# Patient Record
Sex: Male | Born: 2005 | Race: Black or African American | Hispanic: No | Marital: Single | Smoking: Current some day smoker
Health system: Southern US, Community
[De-identification: ages and names within clinical notes are randomized; demographics above are authoritative.]

## PROBLEM LIST (undated history)

## (undated) DIAGNOSIS — D75A Glucose-6-phosphate dehydrogenase (G6PD) deficiency without anemia: Secondary | ICD-10-CM

## (undated) DIAGNOSIS — J45909 Unspecified asthma, uncomplicated: Secondary | ICD-10-CM

## (undated) HISTORY — PX: MYRINGOTOMY WITH TUBE PLACEMENT: SHX5663

---
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
## 2024-07-20 ENCOUNTER — Other Ambulatory Visit: Payer: Self-pay

## 2024-07-20 ENCOUNTER — Ambulatory Visit (HOSPITAL_COMMUNITY)
Admission: EM | Admit: 2024-07-20 | Discharge: 2024-07-21 | Disposition: A | Discharge status: Other Acute Inpatient Hospital | Attending: Psychiatry | Admitting: Psychiatry

## 2024-07-20 DIAGNOSIS — F1290 Cannabis use, unspecified, uncomplicated: Secondary | ICD-10-CM | POA: Diagnosis not present

## 2024-07-20 DIAGNOSIS — F19959 Other psychoactive substance use, unspecified with psychoactive substance-induced psychotic disorder, unspecified: Secondary | ICD-10-CM | POA: Insufficient documentation

## 2024-07-20 DIAGNOSIS — F411 Generalized anxiety disorder: Secondary | ICD-10-CM | POA: Diagnosis not present

## 2024-07-20 LAB — POCT URINE DRUG SCREEN - MANUAL ENTRY (I-SCREEN)
POC Amphetamine UR: NOT DETECTED
POC Buprenorphine (BUP): NOT DETECTED
POC Cocaine UR: NOT DETECTED
POC Marijuana UR: POSITIVE — AB
POC Methadone UR: NOT DETECTED
POC Methamphetamine UR: NOT DETECTED
POC Morphine: NOT DETECTED
POC Oxazepam (BZO): NOT DETECTED
POC Oxycodone UR: NOT DETECTED
POC Secobarbital (BAR): NOT DETECTED

## 2024-07-20 LAB — CBC WITH DIFFERENTIAL/PLATELET
Abs Immature Granulocytes: 0.03 K/uL (ref 0.00–0.07)
Basophils Absolute: 0.1 K/uL (ref 0.0–0.1)
Basophils Relative: 1 %
Eosinophils Absolute: 0.1 K/uL (ref 0.0–0.5)
Eosinophils Relative: 1 %
HCT: 46 % (ref 39.0–52.0)
Hemoglobin: 15.9 g/dL (ref 13.0–17.0)
Immature Granulocytes: 0 %
Lymphocytes Relative: 17 %
Lymphs Abs: 1.5 K/uL (ref 0.7–4.0)
MCH: 30.8 pg (ref 26.0–34.0)
MCHC: 34.6 g/dL (ref 30.0–36.0)
MCV: 89.1 fL (ref 80.0–100.0)
Monocytes Absolute: 0.8 K/uL (ref 0.1–1.0)
Monocytes Relative: 9 %
Neutro Abs: 6.4 K/uL (ref 1.7–7.7)
Neutrophils Relative %: 72 %
Platelets: 492 K/uL — ABNORMAL HIGH (ref 150–400)
RBC: 5.16 MIL/uL (ref 4.22–5.81)
RDW: 11.6 % (ref 11.5–15.5)
WBC: 8.8 K/uL (ref 4.0–10.5)
nRBC: 0 % (ref 0.0–0.2)

## 2024-07-20 LAB — COMPREHENSIVE METABOLIC PANEL WITH GFR
ALT: 18 U/L (ref 0–44)
AST: 29 U/L (ref 15–41)
Albumin: 5 g/dL (ref 3.5–5.0)
Alkaline Phosphatase: 66 U/L (ref 38–126)
Anion gap: 13 (ref 5–15)
BUN: 8 mg/dL (ref 6–20)
CO2: 23 mmol/L (ref 22–32)
Calcium: 9.9 mg/dL (ref 8.9–10.3)
Chloride: 98 mmol/L (ref 98–111)
Creatinine, Ser: 0.89 mg/dL (ref 0.61–1.24)
GFR, Estimated: >60 mL/min (ref >60)
Glucose, Bld: 97 mg/dL (ref 70–99)
Potassium: 2.9 mmol/L — ABNORMAL LOW (ref 3.5–5.1)
Sodium: 134 mmol/L — ABNORMAL LOW (ref 135–145)
Total Bilirubin: 2.3 mg/dL — ABNORMAL HIGH (ref 0.0–1.2)
Total Protein: 7.9 g/dL (ref 6.5–8.1)

## 2024-07-20 LAB — ETHANOL: Alcohol, Ethyl (B): <15 mg/dL (ref <15)

## 2024-07-20 LAB — TSH: TSH: 0.63 u[IU]/mL (ref 0.350–4.500)

## 2024-07-20 LAB — POTASSIUM: Potassium: 2.9 mmol/L — ABNORMAL LOW (ref 3.5–5.1)

## 2024-07-20 MED ORDER — LORAZEPAM 2 MG/ML IJ SOLN: 2.0000 mg | Freq: Three times a day (TID) | INTRAMUSCULAR | Status: DC | PRN

## 2024-07-20 MED ORDER — HALOPERIDOL LACTATE 5 MG/ML IJ SOLN: 5.0000 mg | Freq: Three times a day (TID) | INTRAMUSCULAR | Status: DC | PRN

## 2024-07-20 MED ORDER — DIPHENHYDRAMINE HCL 50 MG PO CAPS
50.0000 mg | ORAL_CAPSULE | Freq: Three times a day (TID) | ORAL | Status: DC | PRN
  Administered 2024-07-20: 50 mg via ORAL
  Filled 2024-07-20: qty 1

## 2024-07-20 MED ORDER — HYDROXYZINE HCL 25 MG PO TABS
25.0000 mg | ORAL_TABLET | Freq: Three times a day (TID) | ORAL | Status: DC | PRN
  Administered 2024-07-20: 25 mg via ORAL
  Filled 2024-07-20: qty 1

## 2024-07-20 MED ORDER — POTASSIUM CHLORIDE CRYS ER 20 MEQ PO TBCR
40.0000 meq | EXTENDED_RELEASE_TABLET | Freq: Once | ORAL | Status: AC
  Administered 2024-07-20: 40 meq via ORAL
  Filled 2024-07-20: qty 2

## 2024-07-20 MED ORDER — ALUM & MAG HYDROXIDE-SIMETH 200-200-20 MG/5ML PO SUSP: 30.0000 mL | ORAL | Status: DC | PRN

## 2024-07-20 MED ORDER — ACETAMINOPHEN 325 MG PO TABS: 650.0000 mg | ORAL_TABLET | Freq: Four times a day (QID) | ORAL | Status: DC | PRN

## 2024-07-20 MED ORDER — HALOPERIDOL LACTATE 5 MG/ML IJ SOLN: 10.0000 mg | Freq: Three times a day (TID) | INTRAMUSCULAR | Status: DC | PRN

## 2024-07-20 MED ORDER — HALOPERIDOL 5 MG PO TABS
5.0000 mg | ORAL_TABLET | Freq: Three times a day (TID) | ORAL | Status: DC | PRN
  Administered 2024-07-20: 5 mg via ORAL
  Filled 2024-07-20: qty 1

## 2024-07-20 MED ORDER — DIPHENHYDRAMINE HCL 50 MG/ML IJ SOLN: 50.0000 mg | Freq: Three times a day (TID) | INTRAMUSCULAR | Status: DC | PRN

## 2024-07-20 MED ORDER — MAGNESIUM HYDROXIDE 400 MG/5ML PO SUSP: 30.0000 mL | Freq: Every day | ORAL | Status: DC | PRN

## 2024-07-20 MED ORDER — TRAZODONE HCL 50 MG PO TABS
50.0000 mg | ORAL_TABLET | Freq: Every evening | ORAL | Status: DC | PRN
  Administered 2024-07-20: 50 mg via ORAL
  Filled 2024-07-20: qty 1

## 2024-07-20 MED ORDER — OLANZAPINE 5 MG PO TABS
5.0000 mg | ORAL_TABLET | Freq: Once | ORAL | Status: AC
  Administered 2024-07-20: 5 mg via ORAL
  Filled 2024-07-20: qty 1

## 2024-07-20 NOTE — ED Notes (Signed)
 Pt presented to [facility] voluntarily and admitted to continuous assessment unit w/ c/o paranoia and psychosis. Hx includes: no pertinent psych hx. Pt appears very anxious upon assessment, fidgety, apologizing a lot and reports he thinks that he thought he was Dexter (from the TV show). Denies SI/HI/AVH.   Skin assessment: unremarkable. Oriented to unit. Denies need of anything at this time. Pt in NAD at this time. Encouragement and support given. PRN atarax  given for anxiety. Will continue to monitor.
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
## 2024-07-20 NOTE — Progress Notes (Signed)
 [facility] LCSW Progress Note   07/20/2024    5:56 PM  Joe Johnston   [id number]   Type of Contact and Topic:  Psychiatric Bed Placement   Pt accepted to [facility] 505-1    Patient meets inpatient criteria per Joe Johnston, PMHNP   The attending provider will be Dr. Raliegh  Call report to [phone]    [name], RN @ [facility] notified.     Pt scheduled  to arrive at [facility] TODAY.    [name], MSW, LCSW-A  5:58 PM 07/20/2024

## 2024-07-20 NOTE — ED Notes (Signed)
 Pt reports feeling better after the hydroxyzine  and appears a lot less anxious. Pt still delusional. Pt asked, I was 18 the whole time?

## 2024-07-20 NOTE — ED Notes (Signed)
 Pt upset and hyperventilating and keeps saying, I think I really did something; I think I really thought I was Dexter. Pt coached through deep breathing and reassurance given as well as prn agitation meds. Provider notified of pt distress and at bedside speaking w/ pt.

## 2024-07-20 NOTE — ED Notes (Signed)
 Pt is lying in recliner, quiet, calm, reading a book. Pt denies having any complaints at this time. He wants to know when he will be transferred to another facility. Staff assured him that he will be updated on any new developments. Staff will continue to monitor pt for safety.

## 2024-07-20 NOTE — Group Note (Deleted)
 Group Topic: Recovery Basics  Group Date: 07/20/2024 Start Time: 0800 End Time: 0900 Facilitators: Rolinda Monta ORN, NT  Department: [facility]  Number of Participants: 6  Group Focus: chemical dependency education, chemical dependency issues, co-dependency, and community group Treatment Modality:  Cognitive Behavioral Therapy Interventions utilized were patient education, story telling, and support Purpose: enhance coping skills, express feelings, and increase insight   Name: Joe Johnston Date of Birth: 09-16-2006  MR: [id number]    Level of Participation: {THERAPIES; PSYCH GROUP PARTICIPATION OZCZO:76008} Quality of Participation: {THERAPIES; PSYCH QUALITY OF PARTICIPATION:23992} Interactions with others: {THERAPIES; PSYCH INTERACTIONS:23993} Mood/Affect: {THERAPIES; PSYCH MOOD/AFFECT:23994} Triggers (if applicable): *** Cognition: {THERAPIES; PSYCH COGNITION:23995} Progress: {THERAPIES; PSYCH PROGRESS:23997} Response: *** Plan: {THERAPIES; PSYCH EOJW:76003}  Patients Problems:  There are no active problems to display for this patient.
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
## 2024-07-20 NOTE — ED Notes (Signed)
 Patient eating dinner.  Calmer no further panic attacks.

## 2024-07-20 NOTE — ED Provider Notes (Cosign Needed Addendum)
 Patient continues to present with acute psychosis with paranoia, disorganized thinking, and emotional dysregulation. Differential includes first-episode psychosis, THC-induced psychosis, or an underlying schizophrenia-spectrum disorder. No immediate danger to self or others observed. Patient is willing to engage in inpatient psychiatric treatment voluntarily. Patient continues to deny suicidal ideation (SI), homicidal ideation (HI), or visual hallucinations (AVH) when directly asked, although behavior suggested internal stimuli. Provided redirection and reality testing (e.g., reassurance that his girlfriend and sister are safe, and he is safe)  Patient received Haldol  5 mg IM and Benadryl  50 mg IM for agitation and disorganization @ 1600.   Zyprexa  5 mg PO ordered to be administered in 3 hours, once initial sedation from Haldol /Benadryl  has stabilized. Patient is monitored closely for side effects or sedation.  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 This deficiency can lead to hemolytic anemia.

## 2024-07-21 ENCOUNTER — Inpatient Hospital Stay (HOSPITAL_COMMUNITY)
Admission: AD | Admit: 2024-07-21 | Discharge: 2024-07-23 | DRG: 897 | Disposition: A | Discharge status: Home / Self Care | Source: Intra-hospital | Attending: Psychiatry | Admitting: Psychiatry

## 2024-07-21 ENCOUNTER — Encounter (HOSPITAL_COMMUNITY): Payer: Self-pay | Admitting: Psychiatry

## 2024-07-21 DIAGNOSIS — Z818 Family history of other mental and behavioral disorders: Secondary | ICD-10-CM | POA: Diagnosis not present

## 2024-07-21 DIAGNOSIS — F1290 Cannabis use, unspecified, uncomplicated: Secondary | ICD-10-CM | POA: Diagnosis present

## 2024-07-21 DIAGNOSIS — Z91018 Allergy to other foods: Secondary | ICD-10-CM | POA: Diagnosis not present

## 2024-07-21 DIAGNOSIS — F17210 Nicotine dependence, cigarettes, uncomplicated: Secondary | ICD-10-CM | POA: Diagnosis present

## 2024-07-21 DIAGNOSIS — F419 Anxiety disorder, unspecified: Secondary | ICD-10-CM | POA: Diagnosis present

## 2024-07-21 DIAGNOSIS — J45909 Unspecified asthma, uncomplicated: Secondary | ICD-10-CM | POA: Diagnosis present

## 2024-07-21 DIAGNOSIS — D75A Glucose-6-phosphate dehydrogenase (G6PD) deficiency without anemia: Secondary | ICD-10-CM | POA: Diagnosis present

## 2024-07-21 DIAGNOSIS — Z79899 Other long term (current) drug therapy: Secondary | ICD-10-CM

## 2024-07-21 DIAGNOSIS — Z716 Tobacco abuse counseling: Secondary | ICD-10-CM | POA: Diagnosis not present

## 2024-07-21 DIAGNOSIS — F19959 Other psychoactive substance use, unspecified with psychoactive substance-induced psychotic disorder, unspecified: Secondary | ICD-10-CM | POA: Diagnosis present

## 2024-07-21 LAB — POTASSIUM: Potassium: 3.7 mmol/L (ref 3.5–5.1)

## 2024-07-21 MED ORDER — ALUM & MAG HYDROXIDE-SIMETH 200-200-20 MG/5ML PO SUSP: 30.0000 mL | ORAL | Status: DC | PRN

## 2024-07-21 MED ORDER — MAGNESIUM HYDROXIDE 400 MG/5ML PO SUSP: 30.0000 mL | Freq: Every day | ORAL | Status: DC | PRN

## 2024-07-21 MED ORDER — HALOPERIDOL LACTATE 5 MG/ML IJ SOLN: 10.0000 mg | Freq: Three times a day (TID) | INTRAMUSCULAR | Status: DC | PRN

## 2024-07-21 MED ORDER — DIPHENHYDRAMINE HCL 25 MG PO CAPS: 50.0000 mg | ORAL_CAPSULE | Freq: Three times a day (TID) | ORAL | Status: DC | PRN

## 2024-07-21 MED ORDER — ENSURE PLUS HIGH PROTEIN PO LIQD
237.0000 mL | Freq: Two times a day (BID) | ORAL | Status: DC
  Administered 2024-07-21 – 2024-07-22 (×2): 237 mL via ORAL
  Filled 2024-07-21 (×7): qty 237

## 2024-07-21 MED ORDER — TRAZODONE HCL 50 MG PO TABS
50.0000 mg | ORAL_TABLET | Freq: Every evening | ORAL | Status: DC | PRN
  Administered 2024-07-21: 50 mg via ORAL
  Filled 2024-07-21 (×2): qty 1

## 2024-07-21 MED ORDER — HALOPERIDOL LACTATE 5 MG/ML IJ SOLN: 5.0000 mg | Freq: Three times a day (TID) | INTRAMUSCULAR | Status: DC | PRN

## 2024-07-21 MED ORDER — LORAZEPAM 2 MG/ML IJ SOLN: 2.0000 mg | Freq: Three times a day (TID) | INTRAMUSCULAR | Status: DC | PRN

## 2024-07-21 MED ORDER — ACETAMINOPHEN 325 MG PO TABS: 650.0000 mg | ORAL_TABLET | Freq: Four times a day (QID) | ORAL | Status: DC | PRN

## 2024-07-21 MED ORDER — RISPERIDONE 0.5 MG PO TABS
0.5000 mg | ORAL_TABLET | Freq: Every day | ORAL | Status: DC
  Administered 2024-07-21 – 2024-07-22 (×2): 0.5 mg via ORAL
  Filled 2024-07-21 (×2): qty 1

## 2024-07-21 MED ORDER — HYDROXYZINE HCL 25 MG PO TABS
25.0000 mg | ORAL_TABLET | Freq: Three times a day (TID) | ORAL | Status: DC | PRN
  Administered 2024-07-21 – 2024-07-22 (×2): 25 mg via ORAL
  Filled 2024-07-21 (×3): qty 1

## 2024-07-21 MED ORDER — HALOPERIDOL 5 MG PO TABS: 5.0000 mg | ORAL_TABLET | Freq: Three times a day (TID) | ORAL | Status: DC | PRN

## 2024-07-21 MED ORDER — DIPHENHYDRAMINE HCL 50 MG/ML IJ SOLN: 50.0000 mg | Freq: Three times a day (TID) | INTRAMUSCULAR | Status: DC | PRN

## 2024-07-21 NOTE — H&P (Cosign Needed)
 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  He also reportedly endorsed depressive symptoms related to recent break-up with his girlfriend, as well as concerns about compulsive sexual behaviors.  Evaluation on unit: During today's evaluation, patient reports his first use of THC edibles occurred two Saturdays ago, after which he became extremely paranoid, believing clouds were UFOs coming to get him.  Reports significant memory gaps from that time but recalls feeling scared and disoriented. He states that this was the only time he felt like he was "not himself." He reports spiraling emotionally after a breakup with his girlfriend this past Monday. He identifies the breakup as his primary stressor and describes the relationship as stressful for both parties.  States he ended the relationship due to ongoing tension. He denies any previous psychiatric diagnoses, psychiatric hospitalizations, or medication trials. He also denies current or past therapy.  He denies current or past suicidal ideation, plan, or intent, as well as homicidal ideation and self-harm behaviors. He denies access to firearms.  Reports no history of trauma, abuse, or head injury. He denies hallucinations, paranoia, or delusions currently, though he did report auditory  and visual hallucinations while under the influence of THC edibles.  He endorses depressive symptoms including poor sleep, low energy, decreased appetite (with unintentional weight loss), and difficulty concentrating. He reports difficulty falling asleep due to racing thoughts and distractibility with phone use, TV, or phone conversations. He denies current manic symptoms. He also reports symptoms of anxiety, including excessive worry, restlessness, poor sleep, and difficulty focusing.  He reports daily THC use via pens and edibles. Occasional alcohol use is reported (a Beat[employer] drink on his birthday in [birth year]). Denies nicotine use. No history of seizures.  Reports medical history is notable for asthma (resolved) and a left knee meniscus repair in May after a basketball injury.  Reports he is allergic to fish.  Reports he identifies as heterosexual and gender identity male.  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  Reports he works flexible hours with [employer]. Reports strong support system including his mother, father, sister, and grandparents.  Reports religious affiliation as Sherlean.  States hobbies include working out, Dietitian, piano, cooking, music, and walking outdoors.  Objectively, patient appeared anxious but cooperative throughout the interview.  No overt psychotic features observed at the time of assessment. Judgment and insight fair. The  case was reviewed with the attending psychiatrist with the treatment plan as noted below.    Collateral information obtained in ED by NP: Patient is accompanied by his mother and father, who were waiting in the lobby, patient gave this provider permission to speak with parents.  Patient's mother states that everything started on last Sunday, states he broke up with his girlfriend, thought she was cheating on him with his best  friend [name].  She states patient usually is a mild mannered, calm, she will guy, she  states all this excessive talking is usually not him.  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  She states that on Tuesday of this past week, he was at rehab for postop knee surgery in May 2025, and called her and said some very ugly and hurtful things to her, she states they are usually very close.  She states at that he spoke with her he called his younger sister who is 54 years old and continued to say bad things about their mother, she states they have an old twin bed in the attic and he pulled it down and moved to his bed to his sister's room and told his sister he was scared.  His sister called their mother and told her that was she was scared of her brother.  She states when eventually made them bring him in his that patient would ask if his family members were real, they would ask if he was real.  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  Patient's mother states that when he began acting like a different person she told him to use the name Neb, which has been backwards, because he did not think he was his self. Mother states she and her daughter are diagnosed with anxiety, and ADD, and maternal brother with ADHD and has a past drug abuse hx. Mother states that patient had other plans for the summer, but had to have knee surgery, which derailed his plans, and he began smoking marijuana more, and eating thc edibles.    Associated Signs/Symptoms: Depression Symptoms:  insomnia, difficulty concentrating, anxiety, loss of energy/fatigue, weight loss, decreased labido, decreased appetite, (Hypo) Manic  Symptoms:  Denies active symptoms Anxiety Symptoms:  Excessive Worry, Psychotic Symptoms:  Denies active symptoms PTSD Symptoms: Denies Total Time spent with patient: 1.5 hours  Past Psychiatric History: As listed above  Is the patient at risk to self? No.  Has the patient been a risk to self in the past 6 months? No.  Has the patient been a risk to self within the distant past? No.  Is the patient a risk to others? No.  Has the patient been a risk to others in the past 6 months? No.  Has the patient been a risk to others within the distant past? No.   [place] Scale:  Flowsheet Row [facility] (Current) [facility] from 07[facility]20/2024 in [facility]  C-SSRS RISK CATEGORY No Risk No Risk     Prior Inpatient Therapy: No. If yes, describe Denies Prior Outpatient Therapy: No. If yes, describe Denies  Alcohol Screening: Patient refused Alcohol Screening Tool: Yes 1. How often do you have a drink containing alcohol?: Monthly or less 2. How many drinks containing alcohol do you have on a typical day when you are drinking?: 1 or 2 3. How often do you have six or more drinks on one occasion?: Less than monthly AUDIT-C Score: 2 4. How often during the last year have you found that you were not able to stop drinking once you had started?: Never 5. How often during the last year have you  failed to do what was normally expected from you because of drinking?: Never 6. How often during the last year have you needed a first drink in the morning to get yourself going after a heavy drinking session?: Never 7. How often during the last year have you had a feeling of guilt of remorse after drinking?: Never 8. How often during the last year have you been unable to remember what happened the night before because you had been drinking?: Never 9. Have you or someone else been injured as a result of your drinking?: No 10. Has a  relative or friend or a doctor or another health worker been concerned about your drinking or suggested you cut down?: No Alcohol Use Disorder Identification Test Final Score (AUDIT): 2 Alcohol Brief Interventions/Follow-up: Alcohol education/Brief advice Substance Abuse History in the last 12 months:  Yes.   Consequences of Substance Abuse: Psychosis Previous Psychotropic Medications: No  Psychological Evaluations: No  Past Medical History:  Past Medical History:  Diagnosis Date   Asthma    G6PD deficiency     Past Surgical History:  Procedure Laterality Date   MYRINGOTOMY WITH TUBE PLACEMENT     Family History: History reviewed. No pertinent family history. Family Psychiatric  History: As listed above Tobacco Screening:  Social History   Tobacco Use  Smoking Status Some Days   Types: Cigarettes  Smokeless Tobacco Not on file    BH Tobacco Counseling     Are you interested in Tobacco Cessation Medications?  No, patient refused Counseled patient on smoking cessation:  Yes Reason Tobacco Screening Not Completed: No value filed.       Social History:  Social History   Substance and Sexual Activity  Alcohol Use Not Currently   Comment: patient uses alcohol occasionally     Social History   Substance and Sexual Activity  Drug Use Yes   Frequency: 4.0 times per week   Types: Marijuana   Comment: Uses 3/4 times per week    Additional Social History: Marital status: Single Are you sexually active?: No What is your sexual orientation?: Heterosexual Has your sexual activity been affected by drugs, alcohol, medication, or emotional stress?: No Does patient have children?: No                         Allergies:   Allergies  Allergen Reactions   Fish Allergy    Lab Results:  Results for orders placed or performed during the hospital encounter of 07/20/24 (from the past 48 hours)  CBC with Differential/Platelet     Status: Abnormal   Collection Time:  07/20/24 12:36 PM  Result Value Ref Range   WBC 8.8 4.0 - 10.5 K/uL   RBC 5.16 4.22 - 5.81 MIL/uL   Hemoglobin 15.9 13.0 - 17.0 g/dL   HCT 53.9 60.9 - 47.9 %   MCV 89.1 80.0 - 100.0 fL   MCH 30.8 26.0 - 34.0 pg   MCHC 34.6 30.0 - 36.0 g/dL   RDW 88.3 88.4 - 84.4 %   Platelets 492 (H) 150 - 400 K/uL   nRBC 0.0 0.0 - 0.2 %   Neutrophils Relative % 72 %   Neutro Abs 6.4 1.7 - 7.7 K/uL   Lymphocytes Relative 17 %   Lymphs Abs 1.5 0.7 - 4.0 K/uL   Monocytes Relative 9 %   Monocytes Absolute 0.8 0.1 - 1.0 K/uL   Eosinophils Relative 1 %   Eosinophils Absolute 0.1 0.0 -  0.5 K/uL   Basophils Relative 1 %   Basophils Absolute 0.1 0.0 - 0.1 K/uL   Immature Granulocytes 0 %   Abs Immature Granulocytes 0.03 0.00 - 0.07 K/uL    Comment: Performed at [facility], [address], [place], [place] [address]  Comprehensive metabolic panel     Status: Abnormal   Collection Time: 07/20/24 12:36 PM  Result Value Ref Range   Sodium 134 (L) 135 - 145 mmol/L   Potassium 2.9 (L) 3.5 - 5.1 mmol/L   Chloride 98 98 - 111 mmol/L   CO2 23 22 - 32 mmol/L   Glucose, Bld 97 70 - 99 mg/dL    Comment: Glucose reference range applies only to samples taken after fasting for at least 8 hours.   BUN 8 6 - 20 mg/dL   Creatinine, Ser 9.10 0.61 - 1.24 mg/dL   Calcium 9.9 8.9 - 89.6 mg/dL   Total Protein 7.9 6.5 - 8.1 g/dL   Albumin 5.0 3.5 - 5.0 g/dL   AST 29 15 - 41 U/L   ALT 18 0 - 44 U/L   Alkaline Phosphatase 66 38 - 126 U/L   Total Bilirubin 2.3 (H) 0.0 - 1.2 mg/dL   GFR, Estimated >39 >39 mL/min    Comment: (NOTE) Calculated using the CKD-EPI Creatinine Equation (2021)    Anion gap 13 5 - 15    Comment: Performed at [facility], [address], [place], [place] [address]  Ethanol     Status: None   Collection Time: 07/20/24 12:36 PM  Result Value Ref Range   Alcohol, Ethyl (B) <15 <15 mg/dL    Comment: (NOTE) For medical purposes only. Performed at [facility], [address], [place], [place] [address]   TSH     Status: None   Collection Time: 07/20/24 12:36 PM  Result Value Ref Range   TSH 0.630 0.350 - 4.500 uIU/mL    Comment: Performed by a 3rd Generation assay with a functional sensitivity of <=0.01 uIU/mL. Performed at [facility], [address], [place], [place] [address]   POCT Urine Drug Screen - (I-Screen)     Status: Abnormal   Collection Time: 07/20/24 12:40 PM  Result Value Ref Range   POC Amphetamine UR None Detected NONE DETECTED (Cut Off Level 1000 ng/mL)   POC Secobarbital (BAR) None Detected NONE DETECTED (Cut Off Level 300 ng/mL)   POC Buprenorphine (BUP) None Detected NONE DETECTED (Cut Off Level 10 ng/mL)   POC Oxazepam (BZO) None Detected NONE DETECTED (Cut Off Level 300 ng/mL)   POC Cocaine UR None Detected NONE DETECTED (Cut Off Level 300 ng/mL)   POC Methamphetamine UR None Detected NONE DETECTED (Cut Off Level 1000 ng/mL)   POC Morphine None Detected NONE DETECTED (Cut Off Level 300 ng/mL)   POC Methadone UR None Detected NONE DETECTED (Cut Off Level 300 ng/mL)   POC Oxycodone UR None Detected NONE DETECTED (Cut Off Level 100 ng/mL)   POC Marijuana UR Positive (A) NONE DETECTED (Cut Off Level 50 ng/mL)  Potassium     Status: Abnormal   Collection Time: 07/20/24  4:53 PM  Result Value Ref Range   Potassium 2.9 (L) 3.5 - 5.1 mmol/L    Comment: Performed at [facility], [address] [place], [place] [address]  Potassium     Status: None   Collection Time: 07/21/24 12:00 AM  Result Value Ref Range  Potassium 3.7 3.5 - 5.1 mmol/L    Comment: Performed at [facility], [address], [place], [place] [address]    Blood Alcohol level:  Lab Results  Component Value Date   [facility] <15 07/20/2024    Metabolic Disorder Labs:  No results found for: HGBA1C, MPG No results found for: PROLACTIN No results found for: CHOL, TRIG, HDL, CHOLHDL, VLDL, LDLCALC  Current Medications: Current  Facility-Administered Medications  Medication Dose Route Frequency Provider Last Rate Last Admin   acetaminophen  (TYLENOL ) tablet 650 mg  650 mg Oral Q6H PRN Motley-Mangrum, Jadeka A, PMHNP       alum & mag hydroxide-simeth (MAALOX/MYLANTA) 200-200-20 MG/5ML suspension 30 mL  30 mL Oral Q4H PRN Motley-Mangrum, Jadeka A, PMHNP       haloperidol  (HALDOL ) tablet 5 mg  5 mg Oral TID PRN Motley-Mangrum, Jadeka A, PMHNP       And   diphenhydrAMINE  (BENADRYL ) capsule 50 mg  50 mg Oral TID PRN Motley-Mangrum, Jadeka A, PMHNP       haloperidol  lactate (HALDOL ) injection 5 mg  5 mg Intramuscular TID PRN Motley-Mangrum, Jadeka A, PMHNP       And   diphenhydrAMINE  (BENADRYL ) injection 50 mg  50 mg Intramuscular TID PRN Motley-Mangrum, Jadeka A, PMHNP       And   LORazepam  (ATIVAN ) injection 2 mg  2 mg Intramuscular TID PRN Motley-Mangrum, Jadeka A, PMHNP       haloperidol  lactate (HALDOL ) injection 10 mg  10 mg Intramuscular TID PRN Motley-Mangrum, Jadeka A, PMHNP       And   diphenhydrAMINE  (BENADRYL ) injection 50 mg  50 mg Intramuscular TID PRN Motley-Mangrum, Jadeka A, PMHNP       And   LORazepam  (ATIVAN ) injection 2 mg  2 mg Intramuscular TID PRN Motley-Mangrum, Jadeka A, PMHNP       feeding supplement (ENSURE PLUS HIGH PROTEIN) liquid 237 mL  237 mL Oral BID BM Bobbitt, Shalon E, NP       hydrOXYzine  (ATARAX ) tablet 25 mg  25 mg Oral TID PRN Motley-Mangrum, Jadeka A, PMHNP   25 mg at 07/21/24 1028   magnesium  hydroxide (MILK OF MAGNESIA) suspension 30 mL  30 mL Oral Daily PRN Motley-Mangrum, Jadeka A, PMHNP       traZODone  (DESYREL ) tablet 50 mg  50 mg Oral QHS PRN Motley-Mangrum, Jadeka A, PMHNP       PTA Medications: No medications prior to admission.    AIMS:  ,  ,  ,  ,  ,  ,    Musculoskeletal: Strength & Muscle Tone: within normal limits Gait & Station: normal Patient leans: N/A            Psychiatric Specialty Exam:  Presentation  General Appearance:  Appropriate for  Environment  Eye Contact: Fleeting  Speech: Clear and Coherent; Pressured  Speech Volume: Normal  Handedness:No data recorded  Mood and Affect  Mood: Anxious  Affect: Full Range   Thought Process  Thought Processes: Disorganized  Duration of Psychotic Symptoms:N/A Past Diagnosis of Schizophrenia or Psychoactive disorder: No  Descriptions of Associations:Tangential  Orientation:No data recorded Thought Content:Paranoid Ideation; Tangential  Hallucinations:Hallucinations: Auditory Description of Auditory Hallucinations: Intermittent perceptual disturbances; states "I hear it now," though denies AVH when directly asked  Ideas of Reference:Paranoia  Suicidal Thoughts:Suicidal Thoughts: No  Homicidal Thoughts:Homicidal Thoughts: No   Sensorium  Memory: Immediate Poor; Recent Poor  Judgment: Poor  Insight: Lacking   Executive Functions  Concentration: Fair  Attention  Span: Poor  Recall: Poor  Fund of Knowledge: Poor  Language: Poor   Psychomotor Activity  Psychomotor Activity: Psychomotor Activity: Increased   Assets  Assets: Communication Skills; Social Support; Desire for Improvement; Housing   Sleep  Sleep: Sleep: Poor  Estimated Sleeping Duration (Last 24 Hours): 0.00 hours   Physical Exam: Physical Exam Vitals and nursing note reviewed.  Constitutional:      General: He is not in acute distress.    Appearance: He is not ill-appearing.  HENT:     Mouth/Throat:     Pharynx: Oropharynx is clear.  Cardiovascular:     Rate and Rhythm: Normal rate.     Pulses: Normal pulses.  Musculoskeletal:        General: Normal range of motion.     Cervical back: Normal range of motion.  Skin:    General: Skin is dry.  Neurological:     Mental Status: He is alert and oriented to person, place, and time.    Review of Systems  Constitutional: Negative.   HENT: Negative.    Respiratory: Negative.    Cardiovascular: Negative.    Gastrointestinal: Negative.   Skin: Negative.   Neurological: Negative.   Psychiatric/Behavioral:  Positive for substance abuse. Negative for depression, hallucinations, memory loss and suicidal ideas. The patient is nervous/anxious and has insomnia.    Blood pressure 132/81, pulse 83, temperature 98 F (36.7 C), temperature source Oral, resp. rate 16, height 5' 9.29 (1.76 m), weight 68.5 kg, SpO2 100%. Body mass index is 22.11 kg/m.  Treatment Plan Summary: Daily contact with patient to assess and evaluate symptoms and progress in treatment and Medication management  Assessment: 18 year old male presenting with substance-induced psychotic episode following first time THC edible use, in the context of recent psychosocial stressors including a break-up.  No prior psychiatric history.  Currently experiencing residual symptoms of depression and anxiety.  No acute safety concerns identified at this time.  Denies ongoing psychotic symptoms outside the context of substance use.  Observation Level/Precautions:  15 minute checks  Laboratory:  Admission labs reviewed:   Psychotherapy:  Group therapies   Medications:    Start risperidone  0.5 mg daily at bedtime Continue hydroxyzine  25 mg 3 times daily as needed Continue trazodone  50 mg daily at bedtime as needed Continue BH Agitation protocol (see MAR) Continue Ensure feeding supplement 2 times daily between meals  Head CT ordered to evaluate new onset psychosis.  As needed medications: Tylenol  650 mg every 6 hours as needed, mild pain Maalox 30 mL every 4 hours as needed, indigestion Milk of magnesia 30 mL daily as needed, mild constipation    Consultations: As needed  Discharge Concerns:    Estimated LOS: 5-7 days   Other:     Physician Treatment Plan for Primary Diagnosis: Psychoactive substance-induced psychosis (HCC) Long Term Goal(s): Improvement in symptoms so as ready for discharge  Short Term Goals: Ability to identify  changes in lifestyle to reduce recurrence of condition will improve, Ability to verbalize feelings will improve, Ability to disclose and discuss suicidal ideas, Ability to demonstrate self-control will improve, Ability to identify and develop effective coping behaviors will improve, Ability to maintain clinical measurements within normal limits will improve, and Ability to identify triggers associated with substance abuse/mental health issues will improve  Physician Treatment Plan for Secondary Diagnosis: Principal Problem:   Psychoactive substance-induced psychosis (HCC)  Long Term Goal(s): Improvement in symptoms so as ready for discharge  Short Term Goals: Ability to identify changes in  lifestyle to reduce recurrence of condition will improve, Ability to verbalize feelings will improve, Ability to demonstrate self-control will improve, Ability to identify and develop effective coping behaviors will improve, Ability to maintain clinical measurements within normal limits will improve, and Ability to identify triggers associated with substance abuse/mental health issues will improve  I certify that inpatient services furnished can reasonably be expected to improve the patient's condition.    Blair Chiquita Hint, NP 8/3/202512:50 PM

## 2024-07-21 NOTE — B[facility] Group Notes (Signed)
 Adult Psychoeducational Group Note  Date:  07/21/2024 Time:  7:45 PM  Group Topic/Focus:  Goals Group:   The focus of this group is to help patients establish daily goals to achieve during treatment and discuss how the patient can incorporate goal setting into their daily lives to aide in recovery. Orientation:   The focus of this group is to educate the patient on the purpose and policies of crisis stabilization and provide a format to answer questions about their admission.  The group details unit policies and expectations of patients while admitted.  Participation Level:  Active  Participation Quality:  Appropriate  Affect:  Appropriate  Cognitive:  Appropriate  Insight: Appropriate  Engagement in Group:  Engaged  Modes of Intervention:  Education  Additional Comments:  Pt attended the goals group and remained appropriate and engaged throughout the duration of the group.   Kelsey Durflinger O 07/21/2024, 7:45 PM

## 2024-07-21 NOTE — Progress Notes (Signed)
   07/21/24 2100  Psych Admission Type (Psych Patients Only)  Admission Status Voluntary  Psychosocial Assessment  Patient Complaints None  Eye Contact Fair  Facial Expression Anxious  Affect Appropriate to circumstance  Speech Logical/coherent  Interaction Assertive  Motor Activity Fidgety  Appearance/Hygiene Unremarkable  Behavior Characteristics Cooperative;Appropriate to situation  Mood Anxious;Pleasant  Thought Process  Coherency WDL  Content Blaming self  Delusions None reported or observed  Perception WDL  Hallucination None reported or observed  Judgment Poor  Confusion Mild  Danger to Self  Current suicidal ideation? Denies

## 2024-07-21 NOTE — Progress Notes (Signed)
(  Sleep Hours) -2 (Any PRNs that were needed, meds refused, or side effects to meds)- none (Any disturbances and when (visitation, over night)-none (Concerns raised by the patient)- none (SI/HI/AVH)-denies all

## 2024-07-21 NOTE — Progress Notes (Incomplete)
 18 year old African-American male with no prior history of mental illness.  Presented in company of family with paranoid delusion, restlessness and perplexity.  History of heavy use of THC based products. We will get a CT of the head as this is a new onset psychosis.  We will use risperidone  0.5 mg at bedtime to target psychosis.  We will gather collateral from family and evaluate him further.

## 2024-07-21 NOTE — [facility] Suicide Risk Assessment (Signed)
 Suicide Risk Assessment  Admission Assessment    [facility] Admission Suicide Risk Assessment   Nursing information obtained from:  Patient Demographic factors:  Male, Adolescent or young adult Current Mental Status:  NA Loss Factors:  Financial problems / change in socioeconomic status, Loss of significant relationship Historical Factors:  NA Risk Reduction Factors:  Positive social support, Living with another person, especially a relative, Religious beliefs about death  Total Time spent with patient: 30 minutes Principal Problem: Psychoactive substance-induced psychosis (HCC) Diagnosis:  Principal Problem:   Psychoactive substance-induced psychosis (HCC)  Subjective Data: See H&P  Continued Clinical Symptoms:  Alcohol Use Disorder Identification Test Final Score (AUDIT): 2 The Alcohol Use Disorders Identification Test, Guidelines for Use in Primary Care, Second Edition.  World Science writer [facility]). Score between 0-7:  no or low risk or alcohol related problems. Score between 8-15:  moderate risk of alcohol related problems. Score between 16-19:  high risk of alcohol related problems. Score 20 or above:  warrants further diagnostic evaluation for alcohol dependence and treatment.   CLINICAL FACTORS:   Alcohol/Substance Abuse/Dependencies   Musculoskeletal: Strength & Muscle Tone: within normal limits Gait & Station: normal Patient leans: N/A  Psychiatric Specialty Exam:  Presentation  General Appearance:  Casual; Fairly Groomed  Eye Contact: Good  Speech: Clear and Coherent  Speech Volume: Normal  Handedness:Right   Mood and Affect  Mood: Anxious  Affect: Congruent   Thought Process  Thought Processes: Goal Directed; Linear  Descriptions of Associations:Intact  Orientation:Full (Time, Place and Person)  Thought Content:Logical  History of Schizophrenia/Schizoaffective disorder:No  Duration of Psychotic  Symptoms:N/A  Hallucinations:Hallucinations: -- (Denies) Description of Auditory Hallucinations: -- (Denies)  Ideas of Reference:None  Suicidal Thoughts:Suicidal Thoughts: No  Homicidal Thoughts:Homicidal Thoughts: No   Sensorium  Memory: Immediate Fair; Recent Fair  Judgment: Fair  Insight: Fair   Art therapist  Concentration: Fair  Attention Span: Fair  Recall: [employer] of Knowledge: Fair  Language: Fair   Psychomotor Activity  Psychomotor Activity: Psychomotor Activity: Normal   Assets  Assets: Communication Skills; Desire for Improvement; Housing; Resilience; Social Support; Vocational/Educational   Sleep  Sleep: Sleep: Fair    Physical Exam: Physical Exam  see H&P  ROS  see H&P  Blood pressure 132/81, pulse 83, temperature 98 F (36.7 C), temperature source Oral, resp. rate 16, height 5' 9.29 (1.76 m), weight 68.5 kg, SpO2 100%. Body mass index is 22.11 kg/m.   COGNITIVE FEATURES THAT CONTRIBUTE TO RISK:  None    SUICIDE RISK:   Minimal: No identifiable suicidal ideation.  Patients presenting with no risk factors but with morbid ruminations; may be classified as minimal risk based on the severity of the depressive symptoms  PLAN OF CARE: See H&P  I certify that inpatient services furnished can reasonably be expected to improve the patient's condition.   Blair Chiquita Hint, NP 07/21/2024, 2:16 PM

## 2024-07-21 NOTE — ED Notes (Signed)
 Pt is asleep in recliner. Respirations even and unlabored. No distress noted. Staff will continue to monitor individual for safety.

## 2024-07-21 NOTE — Plan of Care (Signed)
   Problem: Activity: Goal: Interest or engagement in activities will improve Outcome: Progressing
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
## 2024-07-21 NOTE — Progress Notes (Signed)
 Assumed care from previous RN  Patient observed to be mildly anxious on unit. Patient able to make needs known. Patient observed to engage appropriately with staff and peers. Patient taking medications as prescribed. This shift, no PRN medication requested or required. No observed or reported side effects to medication. No observed or reported agitation, aggression, or other acute emotional distress. No observed or reported physical abnormalities or concerns.

## 2024-07-21 NOTE — Tx Team (Signed)
 Initial Treatment Plan 07/21/2024 4:45 AM Morene Paradise FMW:[id number]    PATIENT STRESSORS: Financial difficulties   Marital or family conflict   Substance abuse     PATIENT STRENGTHS: Printmaker for treatment/growth  Supportive family/friends    PATIENT IDENTIFIED PROBLEMS: Anxiety  Substance abuse  Coping skills  Identify stressors               DISCHARGE CRITERIA:  Ability to meet basic life and health needs Improved stabilization in mood, thinking, and/or behavior Reduction of life-threatening or endangering symptoms to within safe limits Verbal commitment to aftercare and medication compliance  PRELIMINARY DISCHARGE PLAN: Attend PHP/IOP Outpatient therapy Return to previous living arrangement Return to previous work or school arrangements  PATIENT/FAMILY INVOLVEMENT: This treatment plan has been presented to and reviewed with the patient, Joe Johnston, and/or family member.  The patient and family have been given the opportunity to ask questions and make suggestions.  Slater MALVA Edin, RN 07/21/2024, 4:45 AM

## 2024-07-21 NOTE — Group Note (Signed)
 LCSW Group Therapy Note   Group Date: 07/21/2024 Start Time: 1315 End Time: 1400   Type of Therapy and Topic:  Group Therapy: Self-Care  Participation Level:  Did Not Attend  Description of Group: Self-care activities are the things you do to maintain good health and improve well-being. Some self-care activities might already be part of your routine, such as eating regular meals, enjoying a hobby, or spending time with friends. However, during periods of stress, self-care sometimes takes a back seat to other responsibilities.   The Self-Care worksheet discussion and worksheet challenges individuals to think about how frequently, or how well, they are performing various self-care activities. A discussion was had regarding areas self-care can be incorporated into ones life: mental health, spiritual health, physical health, social health, and emotional health.   Therapeutic Modalities:   Cognitive Behavioral Therapy (CBT): Individuals can develop healthier coping mechanisms through self-care to improve their overall quality of life as well as modify behaviors that contribute to unhealthy cycles. Mindfulness: Increasing awareness of the way we take care of ourselves and the impact that can help on our bodies.     Summary of Patient Progress:  Did not attend     Jenkins LULLA Primer, LCSWA 07/21/2024  3:00 PM

## 2024-07-21 NOTE — Group Note (Unsigned)
 Date:  07/21/2024 Time:  9:03 PM  Group Topic/Focus:  Wrap-Up Group:   The focus of this group is to help patients review their daily goal of treatment and discuss progress on daily workbooks.     Participation Level:  {BHH PARTICIPATION OZCZO:77735}  Participation Quality:  {BHH PARTICIPATION QUALITY:22265}  Affect:  {BHH AFFECT:22266}  Cognitive:  {BHH COGNITIVE:22267}  Insight: {BHH Insight2:20797}  Engagement in Group:  {BHH ENGAGEMENT IN HMNLE:77731}  Modes of Intervention:  {BHH MODES OF INTERVENTION:22269}  Additional Comments:  ***  Gwenn Nobie Brooklyn 07/21/2024, 9:03 PM
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
## 2024-07-21 NOTE — Group Note (Unsigned)
 Date:  07/21/2024 Time:  8:31 PM  Group Topic/Focus:  Wrap-Up Group:   The focus of this group is to help patients review their daily goal of treatment and discuss progress on daily workbooks.     Participation Level:  {BHH PARTICIPATION OZCZO:77735}  Participation Quality:  {BHH PARTICIPATION QUALITY:22265}  Affect:  {BHH AFFECT:22266}  Cognitive:  {BHH COGNITIVE:22267}  Insight: {BHH Insight2:20797}  Engagement in Group:  {BHH ENGAGEMENT IN HMNLE:77731}  Modes of Intervention:  {BHH MODES OF INTERVENTION:22269}  Additional Comments:  ***  Joe Johnston 07/21/2024, 8:31 PM

## 2024-07-21 NOTE — Plan of Care (Signed)
   Problem: Education: Goal: Emotional status will improve Outcome: Progressing Goal: Mental status will improve Outcome: Progressing

## 2024-07-22 ENCOUNTER — Encounter (HOSPITAL_COMMUNITY): Payer: Self-pay

## 2024-07-22 ENCOUNTER — Inpatient Hospital Stay (HOSPITAL_COMMUNITY)

## 2024-07-22 DIAGNOSIS — F19959 Other psychoactive substance use, unspecified with psychoactive substance-induced psychotic disorder, unspecified: Secondary | ICD-10-CM | POA: Diagnosis not present

## 2024-07-22 LAB — LIPID PANEL
Cholesterol: 133 mg/dL (ref 0–200)
HDL: 74 mg/dL (ref >40)
LDL Cholesterol: 52 mg/dL (ref 0–99)
Total CHOL/HDL Ratio: 1.8 ratio
Triglycerides: 36 mg/dL (ref <150)
VLDL: 7 mg/dL (ref 0–40)

## 2024-07-22 LAB — HEMOGLOBIN A1C
Hgb A1c MFr Bld: 4.6 % — ABNORMAL LOW (ref 4.8–5.6)
Mean Plasma Glucose: 85 mg/dL

## 2024-07-22 LAB — FOLATE: Folate: 14.2 ng/mL (ref >5.9)

## 2024-07-22 LAB — VITAMIN D 25 HYDROXY (VIT D DEFICIENCY, FRACTURES): Vit D, 25-Hydroxy: 24.83 ng/mL — ABNORMAL LOW (ref 30–100)

## 2024-07-22 LAB — VITAMIN B12: Vitamin B-12: 415 pg/mL (ref 180–914)

## 2024-07-22 MED ORDER — LOPERAMIDE HCL 2 MG PO CAPS: 4.0000 mg | ORAL_CAPSULE | ORAL | Status: DC | PRN

## 2024-07-22 MED ORDER — HYDROXYZINE HCL 25 MG PO TABS
25.0000 mg | ORAL_TABLET | Freq: Every day | ORAL | Status: DC
  Administered 2024-07-22: 25 mg via ORAL
  Filled 2024-07-22: qty 1

## 2024-07-22 MED ORDER — HYDROXYZINE HCL 25 MG PO TABS: 25.0000 mg | ORAL_TABLET | Freq: Two times a day (BID) | ORAL | Status: DC | PRN

## 2024-07-22 MED ORDER — TRAZODONE HCL 50 MG PO TABS
50.0000 mg | ORAL_TABLET | Freq: Every day | ORAL | Status: DC
  Administered 2024-07-22: 50 mg via ORAL
  Filled 2024-07-22: qty 1

## 2024-07-22 NOTE — Progress Notes (Cosign Needed Addendum)
 [facility] MD Progress Note  07/22/2024 9:39 AM Joe Johnston  MRN:  [id number]  Reason for admission: Joe Johnston is a 18 year old African-American male with no prior psychiatric history who was admitted to the [facility] from the [facility] center, where he presented voluntarily accompanied by his parents, due to paranoid and disorganized behavior, suspected to be substance-induced.  He also reportedly endorsed depressive symptoms related to recent break-up with his girlfriend, as well as concerns about compulsive sexual behaviors.   Chart reviewed.  Patient discussed at the multidisciplinary team meeting.  24-hour chart review: Vital Signs within defined range. Received as needed hydroxyzine  and trazodone  overnight. Documented sleep hours 6.25. Adherent to medication.  No reported behavioral issues or concerns.  Nursing staff reports that he is compliant with his medication regimen and cooperative.  No behavioral issues or concerns have been noted.  Yesterday the psychiatry team made the following recommendations: Start risperidone  0.5 mg daily at bedtime   Daily Evaluation: On assessment today, Joe Johnston reports his mood is doing good and states it has improved and remained stable since admission.  He denies feeling down, depressed, or sad, and describes his anxiety symptoms as manageable.  He reports some difficulty staying asleep last night but states that the as needed medication was helpful.  Reports that his appetite is improving, and he continues to receive Ensure nutritional supplements between meals.  He reports adequate energy and no issues with concentration.  He denies suicidal ideation, intent, or plan, and also denies homicidal ideation, psychotic symptoms, medication side effect, or EPS symptoms.  He reports experiencing approximately 2 loose stools, which began after dinner yesterday evening.    Joe Johnston states he has been  attending groups, enjoyed games, structured activities, and social interactions and finds them helpful.  He shared that he spoke to his father this morning, and they discussed the possibility of him taking a semester off from school, which he identified as a source of stress.  Reports his father was supportive.  He also expresses interest in a therapy referral in the [place] area.  Discussed discharge planning and emphasized medication adherence and attendance at follow-up appointments to prevent symptom exacerbation. Discussed psychosocial stressors, including recent break-up, substance use and academic stress. Discussed with patient the critical importance of complete cessation of marijuana to support recovery, highlighting that abstaining can lead to improve mood stability, enhance cognitive function, better sleep and lower anxiety.     Today, Joe Johnston is alert, pleasant, and cooperative.  Mood appears euthymic and stable.  He is engaged in group programming unit activities.  No behavioral issues observed.  No signs of psychosis, agitation, or distress noted during assessment.  The case was reviewed with the attending psychiatrist.  Trazodone  and hydroxyzine  transitioned from as needed to scheduled nightly dosing to enhance sleep quality. Imodium  will be ordered to manage loose stools. Discharge is anticipated on Tuesday, August 5, to the care of his mother and father. Social Work to complete safety planning with family and finalize aftercare planning.   Collateral information: Spoke with the patient's mother Joe Johnston at [phone].  Patient's mother reports that he exhibited no psychiatric symptoms prior to [facility] edible use.  She states that his symptoms began on Sunday, following an abrupt and unexpected break-up with his girlfriend, which she believes was triggered by emerging paranoia.  She reports that he consumed a considerable amount of THC edibles and subsequently began behaving in a  bizarre manner, including excessive sweating.  She reports that he had returned home for the summer and had been spending time with friends and using marijuana.  She reiterates her belief that the paranoia preceded and may have contributed to the break-up.  She reports the patient is currently a Consulting civil engineer at [employer], where she states he had been thriving both academically and socially prior to this incident.  Mother reports that she has spoken with patient since his admission and states that he appears to be at his baseline level of functioning.  She denies any acute safety concerns at this time and shares that she is an OB/GYN physician.   Principal Problem: Psychoactive substance-induced psychosis (HCC) Diagnosis: Principal Problem:   Psychoactive substance-induced psychosis (HCC)  Total Time spent with patient: 45 minutes  Past Psychiatric History: See H&P  Past Medical History:  Past Medical History:  Diagnosis Date   Asthma    G6PD deficiency     Past Surgical History:  Procedure Laterality Date   MYRINGOTOMY WITH TUBE PLACEMENT     Family History: History reviewed. No pertinent family history. Family Psychiatric  History: See H&P Social History:  Social History   Substance and Sexual Activity  Alcohol Use Not Currently   Comment: patient uses alcohol occasionally     Social History   Substance and Sexual Activity  Drug Use Yes   Frequency: 4.0 times per week   Types: Marijuana   Comment: Uses 3/4 times per week    Social History   Socioeconomic History   Marital status: Single    Spouse name: Not on file   Number of children: Not on file   Years of education: Not on file   Highest education level: Not on file  Occupational History   Not on file  Tobacco Use   Smoking status: Some Days    Types: Cigarettes   Smokeless tobacco: Not on file  Substance and Sexual Activity   Alcohol use: Not Currently    Comment: patient uses alcohol occasionally    Drug use: Yes    Frequency: 4.0 times per week    Types: Marijuana    Comment: Uses 3/4 times per week   Sexual activity: Yes  Other Topics Concern   Not on file  Social History Narrative   Not on file   Social Drivers of Health   Financial Resource Strain: Not on file  Food Insecurity: No Food Insecurity (07/21/2024)   Hunger Vital Sign    Worried About Running Out of Food in the Last Year: Never true    Ran Out of Food in the Last Year: Never true  Transportation Needs: No Transportation Needs (07/21/2024)   PRAPARE - Administrator, Civil Service (Medical): No    Lack of Transportation (Non-Medical): No  Physical Activity: Not on file  Stress: Not on file  Social Connections: Not on file   Additional Social History:                         Sleep: Fair Estimated Sleeping Duration (Last 24 Hours): 4.00-5.25 hours  Appetite:  Fair  Current Medications: Current Facility-Administered Medications  Medication Dose Route Frequency Provider Last Rate Last Admin   acetaminophen  (TYLENOL ) tablet 650 mg  650 mg Oral Q6H PRN Motley-Mangrum, Jadeka A, PMHNP       alum & mag hydroxide-simeth (MAALOX/MYLANTA) 200-200-20 MG/5ML suspension 30 mL  30 mL Oral Q4H PRN Motley-Mangrum, Jadeka A, PMHNP  haloperidol  (HALDOL ) tablet 5 mg  5 mg Oral TID PRN Motley-Mangrum, Jadeka A, PMHNP       And   diphenhydrAMINE  (BENADRYL ) capsule 50 mg  50 mg Oral TID PRN Motley-Mangrum, Jadeka A, PMHNP       haloperidol  lactate (HALDOL ) injection 5 mg  5 mg Intramuscular TID PRN Motley-Mangrum, Jadeka A, PMHNP       And   diphenhydrAMINE  (BENADRYL ) injection 50 mg  50 mg Intramuscular TID PRN Motley-Mangrum, Jadeka A, PMHNP       And   LORazepam  (ATIVAN ) injection 2 mg  2 mg Intramuscular TID PRN Motley-Mangrum, Jadeka A, PMHNP       haloperidol  lactate (HALDOL ) injection 10 mg  10 mg Intramuscular TID PRN Motley-Mangrum, Jadeka A, PMHNP       And   diphenhydrAMINE  (BENADRYL )  injection 50 mg  50 mg Intramuscular TID PRN Motley-Mangrum, Jadeka A, PMHNP       And   LORazepam  (ATIVAN ) injection 2 mg  2 mg Intramuscular TID PRN Motley-Mangrum, Jadeka A, PMHNP       feeding supplement (ENSURE PLUS HIGH PROTEIN) liquid 237 mL  237 mL Oral BID BM Bobbitt, Shalon E, NP   237 mL at 07/21/24 1352   hydrOXYzine  (ATARAX ) tablet 25 mg  25 mg Oral TID PRN Motley-Mangrum, Jadeka A, PMHNP   25 mg at 07/22/24 0119   magnesium  hydroxide (MILK OF MAGNESIA) suspension 30 mL  30 mL Oral Daily PRN Motley-Mangrum, Jadeka A, PMHNP       risperiDONE  (RISPERDAL ) tablet 0.5 mg  0.5 mg Oral QHS Labrisha Wuellner H, NP   0.5 mg at 07/21/24 2039   traZODone  (DESYREL ) tablet 50 mg  50 mg Oral QHS PRN Motley-Mangrum, Jadeka A, PMHNP   50 mg at 07/21/24 2058    Lab Results:  Results for orders placed or performed during the hospital encounter of 07/20/24 (from the past 48 hours)  CBC with Differential/Platelet     Status: Abnormal   Collection Time: 07/20/24 12:36 PM  Result Value Ref Range   WBC 8.8 4.0 - 10.5 K/uL   RBC 5.16 4.22 - 5.81 MIL/uL   Hemoglobin 15.9 13.0 - 17.0 g/dL   HCT 53.9 60.9 - 47.9 %   MCV 89.1 80.0 - 100.0 fL   MCH 30.8 26.0 - 34.0 pg   MCHC 34.6 30.0 - 36.0 g/dL   RDW 88.3 88.4 - 84.4 %   Platelets 492 (H) 150 - 400 K/uL   nRBC 0.0 0.0 - 0.2 %   Neutrophils Relative % 72 %   Neutro Abs 6.4 1.7 - 7.7 K/uL   Lymphocytes Relative 17 %   Lymphs Abs 1.5 0.7 - 4.0 K/uL   Monocytes Relative 9 %   Monocytes Absolute 0.8 0.1 - 1.0 K/uL   Eosinophils Relative 1 %   Eosinophils Absolute 0.1 0.0 - 0.5 K/uL   Basophils Relative 1 %   Basophils Absolute 0.1 0.0 - 0.1 K/uL   Immature Granulocytes 0 %   Abs Immature Granulocytes 0.03 0.00 - 0.07 K/uL    Comment: Performed at [facility], [address], [place], [place] [address]  Comprehensive metabolic panel     Status: Abnormal   Collection Time: 07/20/24 12:36 PM  Result Value Ref Range   Sodium 134 (L) 135 -  145 mmol/L   Potassium 2.9 (L) 3.5 - 5.1 mmol/L   Chloride 98 98 - 111 mmol/L   CO2 23 22 - 32 mmol/L  Glucose, Bld 97 70 - 99 mg/dL    Comment: Glucose reference range applies only to samples taken after fasting for at least 8 hours.   BUN 8 6 - 20 mg/dL   Creatinine, Ser 9.10 0.61 - 1.24 mg/dL   Calcium 9.9 8.9 - 89.6 mg/dL   Total Protein 7.9 6.5 - 8.1 g/dL   Albumin 5.0 3.5 - 5.0 g/dL   AST 29 15 - 41 U/L   ALT 18 0 - 44 U/L   Alkaline Phosphatase 66 38 - 126 U/L   Total Bilirubin 2.3 (H) 0.0 - 1.2 mg/dL   GFR, Estimated >39 >39 mL/min    Comment: (NOTE) Calculated using the CKD-EPI Creatinine Equation (2021)    Anion gap 13 5 - 15    Comment: Performed at [facility], [address], [place], [place] [address]  Ethanol     Status: None   Collection Time: 07/20/24 12:36 PM  Result Value Ref Range   Alcohol, Ethyl (B) <15 <15 mg/dL    Comment: (NOTE) For medical purposes only. Performed at [facility], [address] [place], [place] [address]   TSH     Status: None   Collection Time: 07/20/24 12:36 PM  Result Value Ref Range   TSH 0.630 0.350 - 4.500 uIU/mL    Comment: Performed by a 3rd Generation assay with a functional sensitivity of <=0.01 uIU/mL. Performed at [facility], [address] [place], [place] [address]   POCT Urine Drug Screen - (I-Screen)     Status: Abnormal   Collection Time: 07/20/24 12:40 PM  Result Value Ref Range   POC Amphetamine UR None Detected NONE DETECTED (Cut Off Level 1000 ng/mL)   POC Secobarbital (BAR) None Detected NONE DETECTED (Cut Off Level 300 ng/mL)   POC Buprenorphine (BUP) None Detected NONE DETECTED (Cut Off Level 10 ng/mL)   POC Oxazepam (BZO) None Detected NONE DETECTED (Cut Off Level 300 ng/mL)   POC Cocaine UR None Detected NONE DETECTED (Cut Off Level 300 ng/mL)   POC Methamphetamine UR None Detected NONE DETECTED (Cut Off Level 1000 ng/mL)   POC Morphine None Detected NONE DETECTED (Cut Off Level  300 ng/mL)   POC Methadone UR None Detected NONE DETECTED (Cut Off Level 300 ng/mL)   POC Oxycodone UR None Detected NONE DETECTED (Cut Off Level 100 ng/mL)   POC Marijuana UR Positive (A) NONE DETECTED (Cut Off Level 50 ng/mL)  Potassium     Status: Abnormal   Collection Time: 07/20/24  4:53 PM  Result Value Ref Range   Potassium 2.9 (L) 3.5 - 5.1 mmol/L    Comment: Performed at [facility], [address], [place], [place] [address]  Potassium     Status: None   Collection Time: 07/21/24 12:00 AM  Result Value Ref Range   Potassium 3.7 3.5 - 5.1 mmol/L    Comment: Performed at [facility], [address], [place], [place] [address]    Blood Alcohol level:  Lab Results  Component Value Date   [facility] <15 07/20/2024    Metabolic Disorder Labs: No results found for: HGBA1C, MPG No results found for: PROLACTIN No results found for: CHOL, TRIG, HDL, CHOLHDL, VLDL, LDLCALC  Physical Findings: AIMS:  ,  ,  0,  ,  ,  ,   CIWA:   N/A COWS:   N/A  Musculoskeletal: Strength & Muscle Tone: within normal limits Gait & Station: normal Patient  leans: N/A  Psychiatric Specialty Exam:  Presentation  General Appearance:  Casual; Fairly Groomed  Eye Contact: Good  Speech: Clear and Coherent  Speech Volume: Normal  Handedness: Right   Mood and Affect  Mood: Anxious  Affect: Congruent   Thought Process  Thought Processes: Goal Directed; Linear  Descriptions of Associations:Intact  Orientation:Full (Time, Place and Person)  Thought Content:Logical  History of Schizophrenia/Schizoaffective disorder:No  Duration of Psychotic Symptoms:N/A  Hallucinations:Hallucinations: -- (Denies) Description of Auditory Hallucinations: -- (Denies)  Ideas of Reference:None  Suicidal Thoughts:Suicidal Thoughts: No  Homicidal Thoughts:Homicidal Thoughts: No   Sensorium  Memory: Immediate Fair; Recent  Fair  Judgment: Fair  Insight: Fair   Art therapist  Concentration: Fair  Attention Span: Fair  Recall: [employer] of Knowledge: Fair  Language: Fair   Psychomotor Activity  Psychomotor Activity: Psychomotor Activity: Normal   Assets  Assets: Communication Skills; Desire for Improvement; Housing; Resilience; Social Support; Vocational/Educational   Sleep  Sleep: Sleep: Fair    Physical Exam: Physical Exam Vitals and nursing note reviewed.  Constitutional:      General: He is not in acute distress.    Appearance: He is not ill-appearing.  HENT:     Mouth/Throat:     Pharynx: Oropharynx is clear.  Cardiovascular:     Rate and Rhythm: Normal rate.     Pulses: Normal pulses.  Pulmonary:     Effort: No respiratory distress.  Musculoskeletal:        General: Normal range of motion.     Cervical back: Normal range of motion.  Skin:    General: Skin is dry.  Neurological:     General: No focal deficit present.     Mental Status: He is alert and oriented to person, place, and time.    Review of Systems  Constitutional: Negative.   HENT: Negative.    Respiratory: Negative.    Cardiovascular: Negative.   Gastrointestinal: Negative.   Genitourinary: Negative.   Skin: Negative.   Neurological: Negative.   Psychiatric/Behavioral:  Positive for depression and substance abuse. Negative for hallucinations, memory loss and suicidal ideas. The patient is nervous/anxious and has insomnia.    Blood pressure 122/77, pulse 72, temperature 97.9 F (36.6 C), resp. rate 16, height 5' 9.29 (1.76 m), weight 68.5 kg, SpO2 100%. Body mass index is 22.11 kg/m.   Treatment Plan Summary: Daily contact with patient to assess and evaluate symptoms and progress in treatment and Medication management   Assessment: 8/4: Patient is showing clinical improvement in mood and anxiety since admission.  He remains psychiatrically stable and engaged in treatment.  He  demonstrates insight into academic related stress and is exploring appropriate outpatient options.  He appears to have withdrawn from the psychoactive substance without complications.  No acute safety concerns or psychotic symptoms are present.  Medication appears well-tolerated.  Patient with recent history of hypokalemia; currently experiencing mild gastrointestinal symptoms.  Potassium level will be rechecked in the morning.    Observation Level/Precautions:  15 minute checks  Laboratory:  Admission labs reviewed: UDS positive for marijuana.  BAL < 15.  Hemoglobin A1c 4.6.  CMP - Sodium 134, Total Bilirubin 0.3.  CBC with Differential/Platelet - Platelets 492. New lab orders placed by the attending psychiatrist for tomorrow morning: Vitamin D , Vitamin B12, Lipid panel, Folate.  Potassium level ordered for tomorrow morning.  Psychotherapy:  Group therapies   Medications:     Risperidone  0.5 mg daily at bedtime Start hydroxyzine  25 mg nightly Start  trazodone  50 mg nightly  Start hydroxyzine  25 mg 2 times daily as needed BH Agitation protocol (see MAR) Ensure feeding supplement 2 times daily between meals   Head CT ordered to evaluate new onset psychosis.   As needed medications: Imodium  4 mg as needed for 1 dose, loose stools Tylenol  650 mg every 6 hours as needed, mild pain Maalox 30 mL every 4 hours as needed, indigestion Milk of magnesia 30 mL daily as needed, mild constipation      Consultations: As needed  Discharge Concerns:    Estimated LOS: 5-7 days   Other: N/A    Physician Treatment Plan for Primary Diagnosis: Psychoactive substance-induced psychosis (HCC) Long Term Goal(s): Improvement in symptoms so as ready for discharge   Short Term Goals: Ability to identify changes in lifestyle to reduce recurrence of condition will improve, Ability to verbalize feelings will improve, Ability to disclose and discuss suicidal ideas, Ability to demonstrate self-control will improve,  Ability to identify and develop effective coping behaviors will improve, Ability to maintain clinical measurements within normal limits will improve, and Ability to identify triggers associated with substance abuse/mental health issues will improve   Physician Treatment Plan for Secondary Diagnosis: Principal Problem:   Psychoactive substance-induced psychosis (HCC)   Long Term Goal(s): Improvement in symptoms so as ready for discharge   Short Term Goals: Ability to identify changes in lifestyle to reduce recurrence of condition will improve, Ability to verbalize feelings will improve, Ability to demonstrate self-control will improve, Ability to identify and develop effective coping behaviors will improve, Ability to maintain clinical measurements within normal limits will improve, and Ability to identify triggers associated with substance abuse/mental health issues will improve   I certify that inpatient services furnished can reasonably be expected to improve the patient's condition.      Blair Chiquita Hint, NP 07/22/2024, 9:39 AM

## 2024-07-22 NOTE — [facility] Suicide Risk Assessment (Signed)
 [facility] INPATIENT:  Family/Significant Other Suicide Prevention Education  Suicide Prevention Education:  Education Completed; Hardin Poche (mom) [phone],  (name of family member/significant other) has been identified by the patient as the family member/significant other with whom the patient will be residing, and identified as the person(s) who will aid the patient in the event of a mental health crisis (suicidal ideations/suicide attempt).  With written consent from the patient, the family member/significant other has been provided the following suicide prevention education, prior to the and/or following the discharge of the patient.  Mom said patient will return home upon discharge.  Mom said they don't have any guns or weapons.  She will secure mediations and sharp objects (such as knives and scissors).  She didn't have any safety concerns about patient being discharged.  The suicide prevention education provided includes the following: Suicide risk factors Suicide prevention and interventions National Suicide Hotline telephone number [facility] assessment telephone number [facility] Emergency Assistance 911 [facility] and/or Residential Mobile Crisis Unit telephone number  Request made of family/significant other to: Remove weapons (e.g., guns, rifles, knives), all items previously/currently identified as safety concern.   Remove drugs/medications (over-the-counter, prescriptions, illicit drugs), all items previously/currently identified as a safety concern.  The family member/significant other verbalizes understanding of the suicide prevention education information provided.  The family member/significant other agrees to remove the items of safety concern listed above.  Rihanna Marseille O Bernece Gall, LCSWA 07/22/2024, 3:57 PM

## 2024-07-22 NOTE — Progress Notes (Signed)
 Collateral contact - Alquan Morrish (father) [phone]  Father said he will pick up patient on Tuesday, 07/23/2024 at 10 AM.   Clifton Kovacic, LCSWA 07/22/2024

## 2024-07-22 NOTE — Plan of Care (Deleted)

## 2024-07-22 NOTE — BH IP Treatment Plan (Signed)
 Interdisciplinary Treatment and Diagnostic Plan Update  07/22/2024 Time of Session: 10:57 am Joe Johnston MRN: [id number]  Principal Diagnosis: Psychoactive substance-induced psychosis (HCC)  Secondary Diagnoses: Principal Problem:   Psychoactive substance-induced psychosis (HCC)   Current Medications:  Current Facility-Administered Medications  Medication Dose Route Frequency Provider Last Rate Last Admin   acetaminophen  (TYLENOL ) tablet 650 mg  650 mg Oral Q6H PRN Motley-Mangrum, Jadeka A, PMHNP       alum & mag hydroxide-simeth (MAALOX/MYLANTA) 200-200-20 MG/5ML suspension 30 mL  30 mL Oral Q4H PRN Motley-Mangrum, Jadeka A, PMHNP       haloperidol  (HALDOL ) tablet 5 mg  5 mg Oral TID PRN Motley-Mangrum, Jadeka A, PMHNP       And   diphenhydrAMINE  (BENADRYL ) capsule 50 mg  50 mg Oral TID PRN Motley-Mangrum, Jadeka A, PMHNP       haloperidol  lactate (HALDOL ) injection 5 mg  5 mg Intramuscular TID PRN Motley-Mangrum, Jadeka A, PMHNP       And   diphenhydrAMINE  (BENADRYL ) injection 50 mg  50 mg Intramuscular TID PRN Motley-Mangrum, Jadeka A, PMHNP       And   LORazepam  (ATIVAN ) injection 2 mg  2 mg Intramuscular TID PRN Motley-Mangrum, Jadeka A, PMHNP       haloperidol  lactate (HALDOL ) injection 10 mg  10 mg Intramuscular TID PRN Motley-Mangrum, Jadeka A, PMHNP       And   diphenhydrAMINE  (BENADRYL ) injection 50 mg  50 mg Intramuscular TID PRN Motley-Mangrum, Jadeka A, PMHNP       And   LORazepam  (ATIVAN ) injection 2 mg  2 mg Intramuscular TID PRN Motley-Mangrum, Jadeka A, PMHNP       feeding supplement (ENSURE PLUS HIGH PROTEIN) liquid 237 mL  237 mL Oral BID BM Bobbitt, Shalon E, NP   237 mL at 07/22/24 1100   hydrOXYzine  (ATARAX ) tablet 25 mg  25 mg Oral QHS Bennett, Christal H, NP       hydrOXYzine  (ATARAX ) tablet 25 mg  25 mg Oral BID PRN Bennett, Christal H, NP       loperamide  (IMODIUM ) capsule 4 mg  4 mg Oral PRN Bennett, Christal H, NP       magnesium  hydroxide (MILK OF  MAGNESIA) suspension 30 mL  30 mL Oral Daily PRN Motley-Mangrum, Jadeka A, PMHNP       risperiDONE  (RISPERDAL ) tablet 0.5 mg  0.5 mg Oral QHS Bennett, Christal H, NP   0.5 mg at 07/21/24 2039   traZODone  (DESYREL ) tablet 50 mg  50 mg Oral QHS PRN Motley-Mangrum, Jadeka A, PMHNP   50 mg at 07/21/24 2058   PTA Medications: No medications prior to admission.    Patient Stressors: Financial difficulties   Marital or family conflict   Substance abuse    Patient Strengths: Printmaker for treatment/growth  Supportive family/friends   Treatment Modalities: Medication Management, Group therapy, Case management,  1 to 1 session with clinician, Psychoeducation, Recreational therapy.   Physician Treatment Plan for Primary Diagnosis: Psychoactive substance-induced psychosis (HCC) Long Term Goal(s): Improvement in symptoms so as ready for discharge   Short Term Goals: Ability to identify changes in lifestyle to reduce recurrence of condition will improve Ability to verbalize feelings will improve Ability to demonstrate self-control will improve Ability to identify and develop effective coping behaviors will improve Ability to maintain clinical measurements within normal limits will improve Ability to identify triggers associated with substance abuse/mental health issues will improve Ability to disclose and discuss suicidal ideas  Medication  Management: Evaluate patient's response, side effects, and tolerance of medication regimen.  Therapeutic Interventions: 1 to 1 sessions, Unit Group sessions and Medication administration.  Evaluation of Outcomes: Not Progressing  Physician Treatment Plan for Secondary Diagnosis: Principal Problem:   Psychoactive substance-induced psychosis (HCC)  Long Term Goal(s): Improvement in symptoms so as ready for discharge   Short Term Goals: Ability to identify changes in lifestyle to reduce recurrence of condition will improve Ability  to verbalize feelings will improve Ability to demonstrate self-control will improve Ability to identify and develop effective coping behaviors will improve Ability to maintain clinical measurements within normal limits will improve Ability to identify triggers associated with substance abuse/mental health issues will improve Ability to disclose and discuss suicidal ideas     Medication Management: Evaluate patient's response, side effects, and tolerance of medication regimen.  Therapeutic Interventions: 1 to 1 sessions, Unit Group sessions and Medication administration.  Evaluation of Outcomes: Not Progressing   RN Treatment Plan for Primary Diagnosis: Psychoactive substance-induced psychosis (HCC) Long Term Goal(s): Knowledge of disease and therapeutic regimen to maintain health will improve  Short Term Goals: Ability to remain free from injury will improve, Ability to verbalize frustration and anger appropriately will improve, Ability to demonstrate self-control, Ability to participate in decision making will improve, Ability to verbalize feelings will improve, Ability to disclose and discuss suicidal ideas, Ability to identify and develop effective coping behaviors will improve, and Compliance with prescribed medications will improve  Medication Management: RN will administer medications as ordered by provider, will assess and evaluate patient's response and provide education to patient for prescribed medication. RN will report any adverse and/or side effects to prescribing provider.  Therapeutic Interventions: 1 on 1 counseling sessions, Psychoeducation, Medication administration, Evaluate responses to treatment, Monitor vital signs and CBGs as ordered, Perform/monitor CIWA, COWS, AIMS and Fall Risk screenings as ordered, Perform wound care treatments as ordered.  Evaluation of Outcomes: Not Progressing   LCSW Treatment Plan for Primary Diagnosis: Psychoactive substance-induced  psychosis (HCC) Long Term Goal(s): Safe transition to appropriate next level of care at discharge, Engage patient in therapeutic group addressing interpersonal concerns.  Short Term Goals: Engage patient in aftercare planning with referrals and resources, Increase social support, Increase ability to appropriately verbalize feelings, Increase emotional regulation, Facilitate acceptance of mental health diagnosis and concerns, Facilitate patient progression through stages of change regarding substance use diagnoses and concerns, Identify triggers associated with mental health/substance abuse issues, and Increase skills for wellness and recovery  Therapeutic Interventions: Assess for all discharge needs, 1 to 1 time with Social worker, Explore available resources and support systems, Assess for adequacy in community support network, Educate family and significant other(s) on suicide prevention, Complete Psychosocial Assessment, Interpersonal group therapy.  Evaluation of Outcomes: Not Progressing   Progress in Treatment: Attending groups: Yes. Participating in groups: Yes. Taking medication as prescribed: Yes. Toleration medication: Yes. Family/Significant other contact made: No, will contact:  Burlon Centrella father [phone] Patient understands diagnosis: Yes. Discussing patient identified problems/goals with staff: Yes. Medical problems stabilized or resolved: Yes. Denies suicidal/homicidal ideation: Yes. Issues/concerns per patient self-inventory: No. Other: none reported  New problem(s) identified: No, Describe:  none reported  New Short Term/Long Term Goal(s):  Patient Goals:   I would like to work on being more active and talk to more people  Discharge Plan or Barriers: Patient recently admitted. CSW will continue to follow and assess for appropriate referrals and possible discharge planning.    Reason for Continuation of Hospitalization: Anxiety  Depression Suicidal  ideation  Estimated Length of Stay: 5-7 days  Last 3 [place] Suicide Severity Risk Score: Flowsheet Row Ad[facility] (Current) [facility] from 07/20/2024 in [facility]  C-SSRS RISK CATEGORY No Risk No Risk    Last PHQ 2/9 Scores:     No data to display          Scribe for Treatment Team: Benjaman Donia JONELLE ISRAEL 07/22/2024 3:31 PM

## 2024-07-22 NOTE — Progress Notes (Signed)
 Patient slept for 6.25 hours. Patient presents with animated affect and cooperative with care. Patient denies SI, HI and AVH. At this time, patient is pleasant and cooperative with care.

## 2024-07-22 NOTE — Plan of Care (Signed)
  Problem: Activity: Goal: Interest or engagement in activities will improve Outcome: Progressing   Problem: Education: Goal: Emotional status will improve Outcome: Progressing

## 2024-07-22 NOTE — Group Note (Signed)
 Date:  07/22/2024 Time:  9:00 PM  Group Topic/Focus:  Wrap-Up Group:   The focus of this group is to help patients review their daily goal of treatment and discuss progress on daily workbooks.    Participation Level:  Active  Participation Quality:  Appropriate  Affect:  Appropriate  Cognitive:  Appropriate  Insight: Appropriate  Engagement in Group:  Developing/Improving  Modes of Intervention:  Discussion  Additional Comments:  Pt stated his goal for today was to focus on his treatment plan and interact more with staff and peers. Pt stated he accomplished his goals today. Pt stated he talked with his doctor and social worker about his care today. Pt rated his overall day a 10. Pt stated he was able to contact his cousin today which improved his overall day. Pt stated he felt better about himself today. Pt stated he was able to attend all meals. Pt stated he took all medications provided today. Pt stated he attend all groups held today. Pt stated his appetite was pretty good today. Pt rated sleep last night was pretty good. Pt stated the goal tonight was to get some rest. Pt stated he had no physical pain tonight. Pt deny visual hallucinations and auditory issues tonight. Pt denies thoughts of harming himself or others. Pt stated he would alert staff if anything changed  Joe Johnston 07/22/2024, 9:00 PM

## 2024-07-22 NOTE — [facility] Group Notes (Signed)
 Adult Psychoeducational Group Note  Date:  07/22/2024 Time:  8:14 PM  Group Topic/Focus:  Goals Group:   The focus of this group is to help patients establish daily goals to achieve during treatment and discuss how the patient can incorporate goal setting into their daily lives to aide in recovery. Orientation:   The focus of this group is to educate the patient on the purpose and policies of crisis stabilization and provide a format to answer questions about their admission.  The group details unit policies and expectations of patients while admitted.  Participation Level:  Active  Participation Quality:  Appropriate  Affect:  Appropriate  Cognitive:  Appropriate  Insight: Appropriate  Engagement in Group:  Engaged  Modes of Intervention:  Discussion  Additional Comments:  Pt attended the goals group and remained appropriate and engaged throughout the duration of the group.   Wilberto Console O 07/22/2024, 8:14 PM

## 2024-07-22 NOTE — Group Note (Signed)
 Recreation Therapy Group Note   Group Topic:Communication  Group Date: 07/22/2024 Start Time: 1025 End Time: 1100 Facilitators: Celine Dishman-McCall, LRT,CTRS Location: [address] Dayroom   Group Topic: Communication, Problem Solving   Goal Area(s) Addresses:  Patient will effectively listen to complete activity.  Patient will identify communication skills used to make activity successful.  Patient will identify how skills used during activity can be used to reach post d/c goals.    Behavioral Response: Engaged   Intervention: Building surveyor Activity - Geometric pattern cards, pencils, blank paper    Activity: Geometric Drawings.  Three volunteers from the peer group will be shown an abstract picture with a particular arrangement of geometrical shapes.  Each round, one 'speaker' will describe the pattern, as accurately as possible without revealing the image to the group.  The remaining group members will listen and draw the picture to reflect how it is described to them. Patients with the role of 'listener' cannot ask clarifying questions but, may request that the speaker repeat a direction. Once the drawings are complete, the presenter will show the rest of the group the picture and compare how close each person came to drawing the picture. LRT will facilitate a post-activity discussion regarding effective communication and the importance of planning, listening, and asking for clarification in daily interactions with others.  Education: Environmental consultant, Active listening, Support systems, Discharge planning  Education Outcome: Acknowledges understanding/In group clarification offered/Needs additional education.    Affect/Mood: Appropriate   Participation Level: Engaged   Participation Quality: Independent   Behavior: Appropriate   Speech/Thought Process: Focused   Insight: Good   Judgement: Good   Modes of Intervention: Communication   Patient Response to  Interventions:  Engaged   Education Outcome:  In group clarification offered    Clinical Observations/Individualized Feedback: Pt was bright and engaged during group. Pt was focused and attentive to the presentations of others. Pt was also a Educational psychologist. Pt was clear and tried to be as detailed as possible when presenting. Pt was appropriate and interactive during group session.      Plan: Continue to engage patient in RT group sessions 2-3x/week.   Dejour Vos-McCall, LRT,CTRS 07/22/2024 12:53 PM

## 2024-07-22 NOTE — Progress Notes (Signed)
(  Sleep Hours) -6.25 (Any PRNs that were needed, meds refused, or side effects to meds)- trazadone, vistaril  (Any disturbances and when (visitation, over night)-none (Concerns raised by the patient)- none (SI/HI/AVH)-denies all

## 2024-07-22 NOTE — Group Note (Signed)
 LCSW Group Therapy Note   Group Date: 07/22/2024 Start Time: 1100 End Time: 1200     Participation:  patient was present and actively participated in the discussion.   Type of Therapy:  Group Therapy    Topic:  Money Matters: Creating Stability, Confidence and Peace of Mind   Objective: To help participants understand the impact of financial stability on well-being through the lens of Maslow's Hierarchy of Needs and develop practical strategies for budgeting, saving, and debt repayment.   Goals: Increase awareness of spending habits and financial priorities, recognizing how money supports basic needs, security, and relationships. Develop simple budgeting and saving strategies to enhance stability and peace of mind.  Reduce financial stress by creating a realistic debt repayment plan, supporting long-term confidence and well-being.   Summary:  Participants explored how financial stability connects to basic needs, relationships, and self-esteem using Maslow's Hierarchy. They discussed budgeting, saving, and debt repayment strategies, identifying small, manageable changes. Through interactive discussion and self-reflection, they gained insight into their financial habits and created personal action steps for improvement.   Therapeutic Modalities Used: Elements of Cognitive Behavioral Therapy (CBT) - Addressing financial stress and thought patterns. Psychoeducation - Engineer, agricultural. Elements of Motivational Interviewing (MI) - Encouraging realistic, achievable changes. Group Support - Reducing shame and stress through shared experiences.   Joe Johnston Joe Johnston, LCSWA 07/22/2024  5:47 PM

## 2024-07-23 DIAGNOSIS — F19959 Other psychoactive substance use, unspecified with psychoactive substance-induced psychotic disorder, unspecified: Secondary | ICD-10-CM | POA: Diagnosis not present

## 2024-07-23 MED ORDER — RISPERIDONE 0.5 MG PO TABS: 0.5000 mg | ORAL_TABLET | Freq: Every day | ORAL | 0 refills | Status: DC

## 2024-07-23 MED ORDER — VITAMIN D (ERGOCALCIFEROL) 1.25 MG (50000 UNIT) PO CAPS
50000.0000 [IU] | ORAL_CAPSULE | ORAL | Status: DC
  Administered 2024-07-23: 50000 [IU] via ORAL
  Filled 2024-07-23: qty 1

## 2024-07-23 MED ORDER — ENSURE PLUS HIGH PROTEIN PO LIQD: 237.0000 mL | Freq: Two times a day (BID) | ORAL | Status: DC

## 2024-07-23 MED ORDER — VITAMIN D (ERGOCALCIFEROL) 1.25 MG (50000 UNIT) PO CAPS: 50000.0000 [IU] | ORAL_CAPSULE | ORAL | 0 refills | Status: AC

## 2024-07-23 MED ORDER — TRAZODONE HCL 50 MG PO TABS: 50.0000 mg | ORAL_TABLET | Freq: Every day | ORAL | 0 refills | Status: DC

## 2024-07-23 MED ORDER — HYDROXYZINE HCL 25 MG PO TABS: 25.0000 mg | ORAL_TABLET | Freq: Two times a day (BID) | ORAL | 0 refills | Status: AC | PRN

## 2024-07-23 MED ORDER — HYDROXYZINE HCL 25 MG PO TABS: 25.0000 mg | ORAL_TABLET | Freq: Every day | ORAL | 0 refills | Status: AC

## 2024-07-23 NOTE — Progress Notes (Signed)
 Patient was given return to work letter.  Betzaida Cremeens, LCSWA 07/23/2024

## 2024-07-23 NOTE — Plan of Care (Signed)

## 2024-07-23 NOTE — Progress Notes (Signed)
(  Sleep Hours) - 6.25 (Any PRNs that were needed, meds refused, or side effects to meds)- none (Any disturbances and when (visitation, over night) (Concerns raised by the patient)- none (SI/HI/AVH)- none

## 2024-07-23 NOTE — Discharge Summary (Addendum)
 Physician Discharge Summary Note  Patient:  Joe Johnston is an 18 y.o., male MRN:  [id number] DOB:  2006-08-05 Patient phone:  [phone] (home)  Patient address:   [address] [place] [address],  Total Time spent with patient: 30 minutes  Date of Admission:  07/21/2024 Date of Discharge: 07/23/24   Reason for Admission:  Joe Johnston is a 18 year old African-American male with no prior psychiatric history who was admitted to the [facility] from the [facility], where he presented voluntarily accompanied by his parents, due to paranoid and disorganized behavior, suspected to be substance-induced.  He also reportedly endorsed depressive symptoms related to recent break-up with his girlfriend, as well as concerns about compulsive sexual behaviors.   Principal Problem: Psychoactive substance-induced psychosis (HCC) Discharge Diagnoses: Principal Problem:   Psychoactive substance-induced psychosis (HCC)   Past Psychiatric History: See H&P  Past Medical History:  Past Medical History:  Diagnosis Date   Asthma    G6PD deficiency     Past Surgical History:  Procedure Laterality Date   MYRINGOTOMY WITH TUBE PLACEMENT     Family History: History reviewed. No pertinent family history. Family Psychiatric  History: See H&P Social History:  Social History   Substance and Sexual Activity  Alcohol Use Not Currently   Comment: patient uses alcohol occasionally     Social History   Substance and Sexual Activity  Drug Use Yes   Frequency: 4.0 times per week   Types: Marijuana   Comment: Uses 3/4 times per week    Social History   Socioeconomic History   Marital status: Single    Spouse name: Not on file   Number of children: Not on file   Years of education: Not on file   Highest education level: Not on file  Occupational History   Not on file  Tobacco Use   Smoking status: Some Days    Types:  Cigarettes   Smokeless tobacco: Not on file  Substance and Sexual Activity   Alcohol use: Not Currently    Comment: patient uses alcohol occasionally   Drug use: Yes    Frequency: 4.0 times per week    Types: Marijuana    Comment: Uses 3/4 times per week   Sexual activity: Yes  Other Topics Concern   Not on file  Social History Narrative   Not on file   Social Drivers of Health   Financial Resource Strain: Not on file  Food Insecurity: No Food Insecurity (07/21/2024)   Hunger Vital Sign    Worried About Running Out of Food in the Last Year: Never true    Ran Out of Food in the Last Year: Never true  Transportation Needs: No Transportation Needs (07/21/2024)   PRAPARE - Administrator, Civil Service (Medical): No    Lack of Transportation (Non-Medical): No  Physical Activity: Not on file  Stress: Not on file  Social Connections: Not on file    Hospital Course:  During the patient's hospitalization, patient had extensive initial psychiatric evaluation, and follow-up psychiatric evaluations every day.  Psychiatric diagnoses provided upon initial assessment: Principal Problem:  Psychoactive substance-induced psychosis (HCC)  Patient's psychiatric medications were adjusted on admission: N/A  During the hospitalization, the patient was initiated on risperidone  0.5 mg at bedtime to address psychotic symptoms.  Trazodone  50 mg at bedtime and hydroxyzine  50 mg at bedtime were also started to manage restlessness and support sleep.  Patient's care was discussed during  the interdisciplinary team meeting every day during the hospitalization.  The patient denies having side effects to prescribed psychiatric medication.  Gradually, patient started adjusting to milieu. The patient was evaluated each day by a clinical provider to ascertain response to treatment. Improvement was noted by the patient's report of decreasing symptoms, improved sleep and appetite, affect, medication  tolerance, behavior, and participation in unit programming.  Patient was asked each day to complete a self inventory noting mood, mental status, pain, new symptoms, anxiety and concerns.    Symptoms were reported as significantly decreased or resolved completely by discharge.   On day of discharge, the patient reports that their mood is stable. The patient denied having suicidal thoughts for more than 48 hours prior to discharge.  Patient denies having homicidal thoughts.  Patient denies having auditory hallucinations.  Patient denies any visual hallucinations or other symptoms of psychosis. The patient was motivated to continue taking medication with a goal of continued improvement in mental health.   The patient reports their target psychiatric symptoms of paranoid delusion, auditory/visual hallucinations, restlessness and insomnia responded well to the psychiatric medications, and the patient reports overall benefit other psychiatric hospitalization. Supportive psychotherapy was provided to the patient. The patient also participated in regular group therapy while hospitalized. Coping skills, problem solving as well as relaxation therapies were also part of the unit programming.  Labs were reviewed with the patient, and abnormal results were discussed with the patient.  The patient is able to verbalize their individual safety plan to this provider.  # It is recommended to the patient to continue psychiatric medications as prescribed, after discharge from the hospital.    # It is recommended to the patient to follow up with your outpatient psychiatric provider and PCP.  # It was discussed with the patient, the impact of alcohol, drugs, tobacco have been there overall psychiatric and medical wellbeing, and total abstinence from substance use was recommended the patient.ed.  # Prescriptions provided or sent directly to preferred pharmacy at discharge. Patient agreeable to plan. Given opportunity to  ask questions. Appears to feel comfortable with discharge.    # In the event of worsening symptoms, the patient is instructed to call the crisis hotline, 911 and or go to the nearest ED for appropriate evaluation and treatment of symptoms. To follow-up with primary care provider for other medical issues, concerns and or health care needs  # Patient was discharged home in the care of his mother and father with a plan to follow up as noted below.   Physical Findings: AIMS:  , ,0  ,  ,  ,  ,   CIWA:   N/A COWS:  N/A   Musculoskeletal: Strength & Muscle Tone: within normal limits Gait & Station: normal Patient leans: N/A   Psychiatric Specialty Exam:  Presentation  General Appearance:  Casual; Neat  Eye Contact: Good  Speech: Clear and Coherent; Normal Rate  Speech Volume: Normal  Handedness: Right   Mood and Affect  Mood: Euthymic  Affect: Appropriate; Full Range   Thought Process  Thought Processes: Goal Directed; Linear  Descriptions of Associations:Intact  Orientation:Full (Time, Place and Person)  Thought Content:Logical  History of Schizophrenia/Schizoaffective disorder:No  Duration of Psychotic Symptoms:N/A  Hallucinations:Hallucinations: None  Ideas of Reference:None  Suicidal Thoughts:Suicidal Thoughts: No  Homicidal Thoughts:Homicidal Thoughts: No   Sensorium  Memory: Immediate Good; Recent Good; Remote Good  Judgment: Fair  Insight: Good   Executive Functions  Concentration: Good  Attention Span: Good  Recall: Metta Abe of Knowledge: Good  Language: Good   Psychomotor Activity  Psychomotor Activity: Psychomotor Activity: Normal   Assets  Assets: Communication Skills; Desire for Improvement; Financial Resources/Insurance; Physical Health; Social Support; Talents/Skills; Vocational/Educational   Sleep  Sleep: Sleep: Good  Estimated Sleeping Duration (Last 24 Hours): 5.25-6.25 hours   Physical  Exam: Physical Exam Vitals and nursing note reviewed.  HENT:     Mouth/Throat:     Pharynx: Oropharynx is clear.  Cardiovascular:     Rate and Rhythm: Normal rate.     Pulses: Normal pulses.  Pulmonary:     Effort: No respiratory distress.  Musculoskeletal:        General: Normal range of motion.     Cervical back: Normal range of motion.  Skin:    General: Skin is dry.  Neurological:     Mental Status: He is alert and oriented to person, place, and time.  Psychiatric:        Mood and Affect: Mood normal.        Behavior: Behavior normal.        Thought Content: Thought content normal.        Judgment: Judgment normal.    Review of Systems  Constitutional: Negative.   HENT: Negative.    Respiratory: Negative.    Cardiovascular: Negative.   Gastrointestinal: Negative.   Skin: Negative.   Neurological: Negative.   Psychiatric/Behavioral:  Positive for substance abuse (Cessation encouraged). Negative for depression, hallucinations, memory loss and suicidal ideas. The patient is nervous/anxious (Stable for outpatient management) and has insomnia (Stable for outpatient management).    Blood pressure 114/67, pulse 73, temperature 97.9 F (36.6 C), temperature source Oral, resp. rate 18, height 5' 9.29 (1.76 m), weight 68.5 kg, SpO2 100%. Body mass index is 22.11 kg/m.   Social History   Tobacco Use  Smoking Status Some Days   Types: Cigarettes  Smokeless Tobacco Not on file   Tobacco Cessation:  N/A, patient does not currently use tobacco products   Blood Alcohol level:  Lab Results  Component Value Date   [facility] <15 07/20/2024    Metabolic Disorder Labs:  Lab Results  Component Value Date   HGBA1C 4.6 (L) 07/20/2024   MPG 85 07/20/2024   No results found for: PROLACTIN Lab Results  Component Value Date   CHOL 133 07/22/2024   TRIG 36 07/22/2024   HDL 74 07/22/2024   CHOLHDL 1.8 07/22/2024   VLDL 7 07/22/2024   LDLCALC 52 07/22/2024    See  Psychiatric Specialty Exam and Suicide Risk Assessment completed by Attending Physician prior to discharge.  Discharge destination:  Home  Is patient on multiple antipsychotic therapies at discharge:  No   Has Patient had three or more failed trials of antipsychotic monotherapy by history:  No  Recommended Plan for Multiple Antipsychotic Therapies: NA  Discharge Instructions     Diet - low sodium heart healthy   Complete by: As directed    Increase activity slowly   Complete by: As directed       Allergies as of 07/23/2024       Reactions   Fish Allergy         Medication List     TAKE these medications      Indication  feeding supplement Liqd Take 237 mLs by mouth 2 (two) times daily between meals.  Indication: Nutritional Support   hydrOXYzine  25 MG tablet Commonly known as: ATARAX  Take 1 tablet (25 mg total)  by mouth at bedtime.  Indication: Feeling Anxious   hydrOXYzine  25 MG tablet Commonly known as: ATARAX  Take 1 tablet (25 mg total) by mouth 2 (two) times daily as needed for anxiety.  Indication: Feeling Anxious   risperiDONE  0.5 MG tablet Commonly known as: RISPERDAL  Take 1 tablet (0.5 mg total) by mouth at bedtime.  Indication: Mood control   traZODone  50 MG tablet Commonly known as: DESYREL  Take 1 tablet (50 mg total) by mouth at bedtime.  Indication: Sleep   Vitamin D  (Ergocalciferol ) 1.25 MG (50000 UNIT) Caps capsule Commonly known as: DRISDOL  Take 1 capsule (50,000 Units total) by mouth every 7 (seven) days.  Indication: Vitamin D  Deficiency        Follow-up Information     [facility] Follow up on 08/13/2024.   Why: You have an appointment for medication management services on 08/13/24 at 3:00 pm .  The appointment will be Virtual, but you may call to switch to in person.  Please call to confirm the details of the appointment. Contact information: [address] [place] [address] [phone]         [facility] Follow up on 07/29/2024.   Why: You have an appointment for therapy services on 07/29/24 at 1:00 pm.  The appointment will be Virtual, but you may call to switch to in person.  Paperwork has been sent to the email on file.  Please make sure to submit completed paperwork prior to your appointment. Contact information: [address], [place], [place] [address] Phone: [phone]                Follow-up recommendations:  Activity: as tolerated  Diet: heart healthy  Other: -Follow-up with your outpatient psychiatric provider -instructions on appointment date, time, and address (location) are provided to you in discharge paperwork.  -Take your psychiatric medications as prescribed at discharge - instructions are provided to you in the discharge paperwork  -Follow-up with outpatient primary care doctor and other specialists -for management of preventative medicine and chronic medical disease.  -Testing: Follow-up with outpatient provider for abnormal lab results: Vitamin D  deficiency   -If you are prescribed an atypical antipsychotic medication, we recommend that your outpatient psychiatrist follow routine screening for side effects within 3 months of discharge, including monitoring: AIMS scale, height, weight, blood pressure, fasting lipid panel, HbA1c, and fasting blood sugar.   -Recommend total abstinence from alcohol, tobacco, and other illicit drug use at discharge.   -If your psychiatric symptoms recur, worsen, or if you have side effects to your psychiatric medications, call your outpatient psychiatric provider, 911, [phone] or go to the nearest emergency department.  -If suicidal thoughts occur, immediately call your outpatient psychiatric provider, 911, 988 or go to the nearest emergency department.    Signed: Starleen GORMAN Kitty, MD 07/23/2024, 10:57 AM

## 2024-07-23 NOTE — Progress Notes (Signed)
 Upon discharging, Nurse went over AVS educating on medications and follow up appointments. Suicide safety plan was completed. Reviewed with nurse and copy given to patient and placed in chart. Belongings sheet was signed and items returned to patient. Patient denies SI/HI (with no plan) and AVH. Voices no concerns to staff prior discharging off unit. No acute issues or abnormality presented upon discharge. Was safely walked out ride.

## 2024-07-23 NOTE — [facility] Suicide Risk Assessment (Signed)
 Suicide Risk Assessment  Discharge Assessment    [facility] Discharge Suicide Risk Assessment   Principal Problem: Psychoactive substance-induced psychosis (HCC) Discharge Diagnoses: Principal Problem:   Psychoactive substance-induced psychosis (HCC)   Total Time spent with patient: 30 minutes  Musculoskeletal: Strength & Muscle Tone: within normal limits Gait & Station: normal Patient leans: N/A  Psychiatric Specialty Exam  Presentation  General Appearance:  Casual; Neat  Eye Contact: Good  Speech: Clear and Coherent; Normal Rate  Speech Volume: Normal  Handedness: Right   Mood and Affect  Mood: Euthymic  Duration of Depression Symptoms: No data recorded Affect: Appropriate; Full Range   Thought Process  Thought Processes: Goal Directed; Linear  Descriptions of Associations:Intact  Orientation:Full (Time, Place and Person)  Thought Content:Logical  History of Schizophrenia/Schizoaffective disorder:No  Duration of Psychotic Symptoms:N/A  Hallucinations:Hallucinations: None  Ideas of Reference:None  Suicidal Thoughts:Suicidal Thoughts: No  Homicidal Thoughts:Homicidal Thoughts: No   Sensorium  Memory: Immediate Good; Recent Good; Remote Good  Judgment: Fair  Insight: Good   Executive Functions  Concentration: Good  Attention Span: Good  Recall: Good  Fund of Knowledge: Good  Language: Good   Psychomotor Activity  Psychomotor Activity:Psychomotor Activity: Normal   Assets  Assets: Communication Skills; Desire for Improvement; Financial Resources/Insurance; Physical Health; Social Support; Talents/Skills; Vocational/Educational   Sleep  Sleep:Sleep: Good  Estimated Sleeping Duration (Last 24 Hours): 5.25-6.25 hours  Physical Exam: Physical Exam ROS Blood pressure 114/67, pulse 73, temperature 97.9 F (36.6 C), temperature source Oral, resp. rate 18, height 5' 9.29 (1.76 m), weight 68.5 kg, SpO2 100%. Body mass  index is 22.11 kg/m.  Mental Status Per Nursing Assessment::   On Admission:  NA  Demographic Factors:  Adolescent or young adult  Loss Factors: NA  Historical Factors: NA  Risk Reduction Factors:   Sense of responsibility to family, Religious beliefs about death, Living with another person, especially a relative, Positive social support, Positive therapeutic relationship, and Positive coping skills or problem solving skills  Continued Clinical Symptoms:  Alcohol/Substance Abuse/Dependencies  Cognitive Features That Contribute To Risk:  None    Suicide Risk:  Minimal: No identifiable suicidal ideation.  Patients presenting with no risk factors but with morbid ruminations; may be classified as minimal risk based on the severity of the depressive symptoms   Follow-up Information     [facility] Follow up on 08/13/2024.   Why: You have an appointment for medication management services on 08/13/24 at 3:00 pm .  The appointment will be Virtual, but you may call to switch to in person.  Please call to confirm the details of the appointment. Contact information: [address] [place] [address] [phone]         [facility] Follow up on 07/29/2024.   Why: You have an appointment for therapy services on 07/29/24 at 1:00 pm.  The appointment will be Virtual, but you may call to switch to in person.  Paperwork has been sent to the email on file.  Please make sure to submit completed paperwork prior to your appointment. Contact information: [address] [place], [place] [address] Phone: [phone]                Plan Of Care/Follow-up recommendations:  See discharge summary   Blair Chiquita Hint, NP 07/23/2024, 7:05 AM

## 2024-07-23 NOTE — Plan of Care (Signed)
  Problem: Education: Goal: Knowledge of [facility] Education information/materials will improve 07/23/2024 0948 by Elouise Wolm CROME, RN Outcome: Adequate for Discharge 07/23/2024 0948 by Elouise Wolm CROME, RN Outcome: Progressing Goal: Emotional status will improve 07/23/2024 0948 by Elouise Wolm CROME, RN Outcome: Adequate for Discharge 07/23/2024 0948 by Elouise Wolm CROME, RN Outcome: Progressing Goal: Mental status will improve 07/23/2024 0948 by Elouise Wolm CROME, RN Outcome: Adequate for Discharge 07/23/2024 0948 by Elouise Wolm CROME, RN Outcome: Progressing Goal: Verbalization of understanding the information provided will improve 07/23/2024 0948 by Elouise Wolm CROME, RN Outcome: Adequate for Discharge 07/23/2024 0948 by Elouise Wolm CROME, RN Outcome: Progressing   Problem: Activity: Goal: Interest or engagement in activities will improve 07/23/2024 0948 by Elouise Wolm CROME, RN Outcome: Adequate for Discharge 07/23/2024 [phone] by Elouise Wolm CROME, RN Outcome: Progressing Goal: Sleeping patterns will improve 07/23/2024 0948 by Elouise Wolm CROME, RN Outcome: Adequate for Discharge 07/23/2024 0948 by Elouise Wolm CROME, RN Outcome: Progressing   Problem: Coping: Goal: Ability to verbalize frustrations and anger appropriately will improve 07/23/2024 0948 by Elouise Wolm CROME, RN Outcome: Adequate for Discharge 07/23/2024 0948 by Elouise Wolm CROME, RN Outcome: Progressing Goal: Ability to demonstrate self-control will improve 07/23/2024 0948 by Elouise Wolm CROME, RN Outcome: Adequate for Discharge 07/23/2024 0948 by Elouise Wolm CROME, RN Outcome: Progressing   Problem: Health Behavior/Discharge Planning: Goal: Identification of resources available to assist in meeting health care needs will improve 07/23/2024 0948 by Elouise Wolm CROME, RN Outcome: Adequate for Discharge 07/23/2024 0948 by Elouise Wolm CROME, RN Outcome: Progressing Goal: Compliance with  treatment plan for underlying cause of condition will improve 07/23/2024 0948 by Elouise Wolm CROME, RN Outcome: Adequate for Discharge 07/23/2024 0948 by Elouise Wolm CROME, RN Outcome: Progressing   Problem: Physical Regulation: Goal: Ability to maintain clinical measurements within normal limits will improve 07/23/2024 0948 by Elouise Wolm CROME, RN Outcome: Adequate for Discharge 07/23/2024 0948 by Elouise Wolm CROME, RN Outcome: Progressing   Problem: Safety: Goal: Periods of time without injury will increase 07/23/2024 0948 by Elouise Wolm CROME, RN Outcome: Adequate for Discharge 07/23/2024 0948 by Elouise Wolm CROME, RN Outcome: Progressing

## 2024-07-23 NOTE — Progress Notes (Addendum)
  [facility] Adult Case Management Discharge Plan :  Will you be returning to the same living situation after discharge:  Yes,  patient lives with his parents At discharge, do you have transportation home?: Yes,  patient's father, Parnell Spieler, [phone] will pick him up at 10 AM Do you have the ability to pay for your medications: Yes,  patient has insurance  Release of information consent forms completed and in the chart;  Patient's signature needed at discharge.  Patient to Follow up at:  Follow-up Information     [facility] Follow up on 08/13/2024.   Why: You have an appointment for medication management services on 08/13/24 at 3:00 pm .  The appointment will be Virtual, but you may call to switch to in person.  Please call to confirm the details of the appointment. Contact information: [address] [place] [address] [phone]         [facility] Follow up on 07/29/2024.   Why: You have an appointment for therapy services on 07/29/24 at 1:00 pm.  The appointment will be Virtual, but you may call to switch to in person.  Paperwork has been sent to the email on file.  Please make sure to submit completed paperwork prior to your appointment. Contact information: [address] [place], [place] [address] Phone: [phone]                Next level of care provider has access to [facility] Link:no  Safety Planning and Suicide Prevention discussed: Yes,  Hardin Poche (mom) [phone]  Has patient been referred to the Quitline?: Patient does not use tobacco/nicotine products  Patient has been referred for addiction treatment:  At admission, patient tested positive for marijuana.  Patient said that he "had fun with friends" but wants to stop using.  He said prior to this recent use, he last used 2 months ago.  These were the only 2 times he used in the past year.  He reported he is not addicted, and believes he will be able to stop on his  own.  He said he plans to work out instead.   Ecko Beasley O Shuan Statzer, LCSWA 07/23/2024, 8:06 AM

## 2024-08-06 ENCOUNTER — Emergency Department (HOSPITAL_COMMUNITY)
Admission: EM | Admit: 2024-08-06 | Discharge: 2024-08-06 | Disposition: A | Discharge status: Home / Self Care | Source: Home / Self Care | Attending: Student | Admitting: Student

## 2024-08-06 ENCOUNTER — Ambulatory Visit (HOSPITAL_COMMUNITY)
Admission: EM | Admit: 2024-08-06 | Discharge: 2024-08-07 | Disposition: A | Discharge status: Other Acute Inpatient Hospital | Source: Intra-hospital

## 2024-08-06 ENCOUNTER — Other Ambulatory Visit: Payer: Self-pay

## 2024-08-06 ENCOUNTER — Ambulatory Visit (HOSPITAL_COMMUNITY)
Admission: EM | Admit: 2024-08-06 | Discharge: 2024-08-06 | Disposition: A | Discharge status: Other Acute Inpatient Hospital

## 2024-08-06 ENCOUNTER — Encounter (HOSPITAL_COMMUNITY): Payer: Self-pay

## 2024-08-06 DIAGNOSIS — F32A Depression, unspecified: Secondary | ICD-10-CM | POA: Insufficient documentation

## 2024-08-06 DIAGNOSIS — J45909 Unspecified asthma, uncomplicated: Secondary | ICD-10-CM | POA: Insufficient documentation

## 2024-08-06 DIAGNOSIS — F1914 Other psychoactive substance abuse with psychoactive substance-induced mood disorder: Secondary | ICD-10-CM | POA: Insufficient documentation

## 2024-08-06 DIAGNOSIS — Z008 Encounter for other general examination: Secondary | ICD-10-CM | POA: Insufficient documentation

## 2024-08-06 DIAGNOSIS — F061 Catatonic disorder due to known physiological condition: Secondary | ICD-10-CM

## 2024-08-06 DIAGNOSIS — F3289 Other specified depressive episodes: Secondary | ICD-10-CM | POA: Diagnosis not present

## 2024-08-06 DIAGNOSIS — F19959 Other psychoactive substance use, unspecified with psychoactive substance-induced psychotic disorder, unspecified: Secondary | ICD-10-CM

## 2024-08-06 DIAGNOSIS — F12159 Cannabis abuse with psychotic disorder, unspecified: Secondary | ICD-10-CM | POA: Diagnosis not present

## 2024-08-06 DIAGNOSIS — R5383 Other fatigue: Secondary | ICD-10-CM

## 2024-08-06 DIAGNOSIS — E876 Hypokalemia: Secondary | ICD-10-CM

## 2024-08-06 LAB — CBC WITH DIFFERENTIAL/PLATELET
Abs Immature Granulocytes: 0.02 K/uL (ref 0.00–0.07)
Basophils Absolute: 0 K/uL (ref 0.0–0.1)
Basophils Relative: 1 %
Eosinophils Absolute: 0 K/uL (ref 0.0–0.5)
Eosinophils Relative: 1 %
HCT: 40.8 % (ref 39.0–52.0)
Hemoglobin: 14 g/dL (ref 13.0–17.0)
Immature Granulocytes: 0 %
Lymphocytes Relative: 21 %
Lymphs Abs: 1.4 K/uL (ref 0.7–4.0)
MCH: 31 pg (ref 26.0–34.0)
MCHC: 34.3 g/dL (ref 30.0–36.0)
MCV: 90.5 fL (ref 80.0–100.0)
Monocytes Absolute: 0.5 K/uL (ref 0.1–1.0)
Monocytes Relative: 7 %
Neutro Abs: 4.7 K/uL (ref 1.7–7.7)
Neutrophils Relative %: 70 %
Platelets: 369 K/uL (ref 150–400)
RBC: 4.51 MIL/uL (ref 4.22–5.81)
RDW: 11.9 % (ref 11.5–15.5)
WBC: 6.7 K/uL (ref 4.0–10.5)
nRBC: 0 % (ref 0.0–0.2)

## 2024-08-06 LAB — ACETAMINOPHEN LEVEL: Acetaminophen (Tylenol), Serum: <10 ug/mL — ABNORMAL LOW (ref 10–30)

## 2024-08-06 LAB — URINALYSIS, ROUTINE W REFLEX MICROSCOPIC
Bacteria, UA: NONE SEEN
Bilirubin Urine: NEGATIVE
Glucose, UA: NEGATIVE mg/dL
Ketones, ur: 20 mg/dL — AB
Leukocytes,Ua: NEGATIVE
Nitrite: NEGATIVE
Protein, ur: NEGATIVE mg/dL
Specific Gravity, Urine: 1.003 — ABNORMAL LOW (ref 1.005–1.030)
pH: 6 (ref 5.0–8.0)

## 2024-08-06 LAB — COMPREHENSIVE METABOLIC PANEL WITH GFR
ALT: 14 U/L (ref 0–44)
AST: 22 U/L (ref 15–41)
Albumin: 4.5 g/dL (ref 3.5–5.0)
Alkaline Phosphatase: 55 U/L (ref 38–126)
Anion gap: 12 (ref 5–15)
BUN: 5 mg/dL — ABNORMAL LOW (ref 6–20)
CO2: 22 mmol/L (ref 22–32)
Calcium: 9.5 mg/dL (ref 8.9–10.3)
Chloride: 102 mmol/L (ref 98–111)
Creatinine, Ser: 0.94 mg/dL (ref 0.61–1.24)
GFR, Estimated: >60 mL/min (ref >60)
Glucose, Bld: 141 mg/dL — ABNORMAL HIGH (ref 70–99)
Potassium: 3.2 mmol/L — ABNORMAL LOW (ref 3.5–5.1)
Sodium: 136 mmol/L (ref 135–145)
Total Bilirubin: 1.5 mg/dL — ABNORMAL HIGH (ref 0.0–1.2)
Total Protein: 7 g/dL (ref 6.5–8.1)

## 2024-08-06 LAB — RAPID URINE DRUG SCREEN, HOSP PERFORMED
Amphetamines: NOT DETECTED
Barbiturates: NOT DETECTED
Benzodiazepines: NOT DETECTED
Cocaine: NOT DETECTED
Opiates: NOT DETECTED
Tetrahydrocannabinol: POSITIVE — AB

## 2024-08-06 LAB — SALICYLATE LEVEL: Salicylate Lvl: <7 mg/dL — ABNORMAL LOW (ref 7.0–30.0)

## 2024-08-06 LAB — ETHANOL: Alcohol, Ethyl (B): <15 mg/dL (ref <15)

## 2024-08-06 MED ORDER — DIPHENHYDRAMINE HCL 50 MG/ML IJ SOLN: 50.0000 mg | Freq: Three times a day (TID) | INTRAMUSCULAR | Status: DC | PRN

## 2024-08-06 MED ORDER — DIPHENHYDRAMINE HCL 50 MG PO CAPS
50.0000 mg | ORAL_CAPSULE | Freq: Three times a day (TID) | ORAL | Status: DC | PRN
  Administered 2024-08-06 – 2024-08-07 (×2): 50 mg via ORAL
  Filled 2024-08-06 (×3): qty 1

## 2024-08-06 MED ORDER — LORAZEPAM 2 MG/ML IJ SOLN: 2.0000 mg | Freq: Three times a day (TID) | INTRAMUSCULAR | Status: DC | PRN

## 2024-08-06 MED ORDER — TRAZODONE HCL 50 MG PO TABS: 50.0000 mg | ORAL_TABLET | Freq: Every evening | ORAL | Status: DC | PRN

## 2024-08-06 MED ORDER — MAGNESIUM HYDROXIDE 400 MG/5ML PO SUSP: 30.0000 mL | Freq: Every day | ORAL | Status: DC | PRN

## 2024-08-06 MED ORDER — POTASSIUM CHLORIDE CRYS ER 20 MEQ PO TBCR
40.0000 meq | EXTENDED_RELEASE_TABLET | Freq: Once | ORAL | Status: AC
  Administered 2024-08-06: 40 meq via ORAL
  Filled 2024-08-06: qty 2

## 2024-08-06 MED ORDER — MELATONIN 3 MG PO TABS
ORAL_TABLET | ORAL | Status: AC
Start: 2024-08-06 — End: 2024-08-07
  Filled 2024-08-06: qty 1

## 2024-08-06 MED ORDER — HALOPERIDOL LACTATE 5 MG/ML IJ SOLN: 5.0000 mg | Freq: Three times a day (TID) | INTRAMUSCULAR | Status: DC | PRN

## 2024-08-06 MED ORDER — RISPERIDONE 0.5 MG PO TABS
ORAL_TABLET | ORAL | Status: AC
  Filled 2024-08-06: qty 1

## 2024-08-06 MED ORDER — ALUM & MAG HYDROXIDE-SIMETH 200-200-20 MG/5ML PO SUSP: 30.0000 mL | ORAL | Status: DC | PRN

## 2024-08-06 MED ORDER — HALOPERIDOL 5 MG PO TABS
5.0000 mg | ORAL_TABLET | Freq: Three times a day (TID) | ORAL | Status: DC | PRN
  Administered 2024-08-06 – 2024-08-07 (×2): 5 mg via ORAL
  Filled 2024-08-06 (×3): qty 1

## 2024-08-06 MED ORDER — MELATONIN 3 MG PO TABS: 3.0000 mg | ORAL_TABLET | Freq: Every evening | ORAL | Status: DC | PRN

## 2024-08-06 MED ORDER — HYDROXYZINE HCL 25 MG PO TABS
25.0000 mg | ORAL_TABLET | Freq: Three times a day (TID) | ORAL | Status: DC | PRN
  Administered 2024-08-06: 25 mg via ORAL
  Filled 2024-08-06 (×2): qty 1

## 2024-08-06 MED ORDER — RISPERIDONE 0.5 MG PO TABS
0.5000 mg | ORAL_TABLET | Freq: Every day | ORAL | Status: DC
  Administered 2024-08-06: 0.5 mg via ORAL

## 2024-08-06 MED ORDER — HALOPERIDOL LACTATE 5 MG/ML IJ SOLN: 10.0000 mg | Freq: Three times a day (TID) | INTRAMUSCULAR | Status: DC | PRN

## 2024-08-06 MED ORDER — MELATONIN 3 MG PO TABS
3.0000 mg | ORAL_TABLET | Freq: Every evening | ORAL | Status: DC | PRN
  Administered 2024-08-06: 3 mg via ORAL

## 2024-08-06 MED ORDER — ACETAMINOPHEN 325 MG PO TABS: 650.0000 mg | ORAL_TABLET | Freq: Four times a day (QID) | ORAL | Status: DC | PRN

## 2024-08-06 NOTE — ED Notes (Signed)
 Called  [facility]  to take patient to [facility] the will arrive in 5-10  nurse is aware of emtala

## 2024-08-06 NOTE — Discharge Instructions (Signed)
 Transfer to [facility]

## 2024-08-06 NOTE — ED Triage Notes (Signed)
 Pt bib [facility] from [facility] for medical eval. [facility] reported catatonia and fatigue. When ems arrived pt was A&Ox4 with no complaints. [facility] requested medical clearance.

## 2024-08-06 NOTE — ED Provider Notes (Signed)
 [facility] Continuous Assessment Admission H&P  Date: 08/06/24 Patient Name: Joe Johnston MRN: [id number] Chief Complaint:   I took edibles, it was just that one time.   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  During evaluation, patient became lethargic and catatonic and was transferred to the ED for medical clearance.   Patient was seen face to face by this provider and chart reviewed. Patient gave consent for provider to speak with his mother.   Pt was evaluated, treated and medically cleared to return to the [facility] to continue his psychiatric care. He was hypokalemic on arrival, which was replaced with oral potassium. He is improved, alert and responding to questions appropriately. He reports feeling confused/mental fog ever since he took the edibles (Delta 8,9,10 THC gummies).   Patient reports it was just that one time, I can't remember the date, I took like 5 the first day and the next day, I took 3, I also smoked a lot of marijuana over the summer, it was every day and I last smoked before I went into the hospital at the [facility].   Patient reports his mother and sister initially brought him to the [facility] I think they said I was tripping again, my mom gave me some hydroxyzine  and Risperdal . Patient reports remembering going to school in the morning but does not recall what happened at school. During this evaluation. Patient is A &O x 3.  Per chart review: He woke up fine and went to school. Mother dropped him off at [facility] and then needed to return to give him something and he was asking strange questions  about what was going in the class. He asked if he had hurt someone. Last night he asked mother if he had killed his father. When he came home he was anxious and father gave him hydroxyzine  25 mg for anxiety. Because of the delusional thoughts mother decided to bring him to the urgent care. He was walking fine but when they got to the urgent care he started having trouble walking and acting very lethargic.  Total Time spent with patient: 30 minutes  Musculoskeletal  Strength & Muscle Tone: within normal limits Gait & Station: normal Patient leans: N/A  Psychiatric Specialty Exam  Presentation General Appearance:  Fairly Groomed  Eye Contact: Fair  Speech: Slow; Clear and Coherent  Speech Volume: Normal  Handedness: Right   Mood and Affect  Mood: Depressed  Affect: Flat   Thought Process  Thought Processes: Coherent  Descriptions of Associations:Intact  Orientation:Full (Time, Place and Person)  Thought Content:WDL  Diagnosis of Schizophrenia or Schizoaffective disorder in past: No   Hallucinations:Hallucinations: None  Ideas of Reference:None  Suicidal Thoughts:Suicidal Thoughts: No  Homicidal Thoughts:Homicidal Thoughts: No   Sensorium  Memory: Immediate Fair  Judgment: Poor  Insight: Lacking   Executive Functions  Concentration: Fair  Attention Span: Fair  Recall: [employer] of Knowledge: Fair  Language: Fair   Psychomotor Activity  Psychomotor Activity: Psychomotor Activity: Normal   Assets  Assets: Communication Skills; Desire for Improvement   Sleep  Sleep: Sleep: Fair Number of Hours of Sleep: 8   Nutritional Assessment (For OBS and  FBC admissions only) Has the patient had a weight loss or gain of 10 pounds or more in the last 3 months?: No Has the patient had a decrease in food intake/or appetite?: No Does the patient have dental problems?: No Does the patient have eating habits or behaviors that may be  indicators of an eating disorder including binging or inducing vomiting?: No Has the patient recently lost weight without trying?: 0 Has the patient been eating poorly because of a decreased appetite?: 0 Malnutrition Screening Tool Score: 0    Physical Exam Constitutional:      Appearance: He is not toxic-appearing or diaphoretic.  HENT:     Nose: No congestion.  Cardiovascular:     Rate and Rhythm: Normal rate.  Pulmonary:     Effort: No respiratory distress.  Chest:     Chest wall: No tenderness.  Neurological:     Mental Status: He is alert and oriented to person, place, and time.  Psychiatric:        Attention and Perception: Attention and perception normal.        Mood and Affect: Mood is anxious. Affect is flat.        Speech: Speech normal.        Behavior: Behavior is cooperative.        Thought Content: Thought content normal.    Review of Systems  Constitutional:  Negative for chills, diaphoresis and fever.  HENT:  Negative for congestion.   Eyes:  Negative for discharge.  Respiratory:  Negative for cough, shortness of breath and wheezing.   Cardiovascular:  Negative for chest pain and palpitations.  Gastrointestinal:  Negative for diarrhea, nausea and vomiting.  Neurological:  Negative for dizziness, seizures, weakness and headaches.  Psychiatric/Behavioral:  Positive for substance abuse. The patient is nervous/anxious.     Blood pressure 124/73, pulse 81, temperature 99 F (37.2 C), temperature source Oral, resp. rate 20, SpO2 97%. There is no height or weight on file to calculate BMI.  Past Psychiatric History: See H & P   Is the patient at risk to self? No  Has the patient been a risk to self in the past 6 months? Yes .    Has the patient been a risk to self within the distant past? No   Is the patient a risk to others? No   Has the patient been a risk to others in the past 6 months? No   Has the patient been a risk to others within the distant past? No    Past Medical History: See Chart  Family History: N/A  Social History: N/A  Last Labs:  Admission on 08/06/2024, Discharged on 08/06/2024  Component Date Value Ref Range Status   Sodium 08/06/2024 136  135 - 145 mmol/L Final   Potassium 08/06/2024 3.2 (L)  3.5 - 5.1 mmol/L Final   Chloride 08/06/2024 102  98 - 111 mmol/L Final   CO2 08/06/2024 22  22 - 32 mmol/L Final   Glucose, Bld 08/06/2024 141 (H)  70 - 99 mg/dL Final   Glucose reference range applies only to samples taken after fasting for at least 8 hours.   BUN 08/06/2024 5 (L)  6 - 20 mg/dL Final   Creatinine, Ser 08/06/2024 0.94  0.61 - 1.24 mg/dL Final   Calcium 91/80/7974 9.5  8.9 - 10.3 mg/dL Final   Total Protein 91/80/7974 7.0  6.5 - 8.1 g/dL Final   Albumin 91/80/7974 4.5  3.5 - 5.0 g/dL Final  AST 08/06/2024 22  15 - 41 U/L Final   ALT 08/06/2024 14  0 - 44 U/L Final   Alkaline Phosphatase 08/06/2024 55  38 - 126 U/L Final   Total Bilirubin 08/06/2024 1.5 (H)  0.0 - 1.2 mg/dL Final   GFR, Estimated 08/06/2024 >60  >60 mL/min Final   Comment: (NOTE) Calculated using the CKD-EPI Creatinine Equation (2021)    Anion gap 08/06/2024 12  5 - 15 Final   Performed at [facility], [address], [place], [place] [address]   Alcohol, Ethyl (B) 08/06/2024 <15  <15 mg/dL Final   Comment: (NOTE) For medical purposes only. Performed at [facility], [address], [place], [place] [address]    Opiates 08/06/2024 NONE DETECTED  NONE DETECTED Final   Cocaine 08/06/2024 NONE DETECTED  NONE DETECTED Final   Benzodiazepines 08/06/2024 NONE DETECTED  NONE DETECTED Final   Amphetamines 08/06/2024 NONE DETECTED  NONE DETECTED Final   Tetrahydrocannabinol 08/06/2024 POSITIVE (A)  NONE DETECTED Final   Barbiturates 08/06/2024 NONE DETECTED  NONE DETECTED Final   Comment: (NOTE) DRUG SCREEN FOR MEDICAL PURPOSES ONLY.  IF CONFIRMATION IS NEEDED FOR ANY PURPOSE, NOTIFY LAB WITHIN 5 DAYS.  LOWEST DETECTABLE  LIMITS FOR URINE DRUG SCREEN Drug Class                     Cutoff (ng/mL) Amphetamine and metabolites    1000 Barbiturate and metabolites    200 Benzodiazepine                 200 Opiates and metabolites        300 Cocaine and metabolites        300 THC                            50 Performed at [facility], [address] [place], [place] [address]    WBC 08/06/2024 6.7  4.0 - 10.5 K/uL Final   RBC 08/06/2024 4.51  4.22 - 5.81 MIL/uL Final   Hemoglobin 08/06/2024 14.0  13.0 - 17.0 g/dL Final   HCT 91/80/7974 40.8  39.0 - 52.0 % Final   MCV 08/06/2024 90.5  80.0 - 100.0 fL Final   MCH 08/06/2024 31.0  26.0 - 34.0 pg Final   MCHC 08/06/2024 34.3  30.0 - 36.0 g/dL Final   RDW 91/80/7974 11.9  11.5 - 15.5 % Final   Platelets 08/06/2024 369  150 - 400 K/uL Final   nRBC 08/06/2024 0.0  0.0 - 0.2 % Final   Neutrophils Relative % 08/06/2024 70  % Final   Neutro Abs 08/06/2024 4.7  1.7 - 7.7 K/uL Final   Lymphocytes Relative 08/06/2024 21  % Final   Lymphs Abs 08/06/2024 1.4  0.7 - 4.0 K/uL Final   Monocytes Relative 08/06/2024 7  % Final   Monocytes Absolute 08/06/2024 0.5  0.1 - 1.0 K/uL Final   Eosinophils Relative 08/06/2024 1  % Final   Eosinophils Absolute 08/06/2024 0.0  0.0 - 0.5 K/uL Final   Basophils Relative 08/06/2024 1  % Final   Basophils Absolute 08/06/2024 0.0  0.0 - 0.1 K/uL Final   Immature Granulocytes 08/06/2024 0  % Final   Abs Immature Granulocytes 08/06/2024 0.02  0.00 - 0.07 K/uL Final   Performed at [facility], [address] [place], [place] [address]   Salicylate Lvl 08/06/2024 <7.0 (L)  7.0 - 30.0 mg/dL Final   Performed at [facility], [address] [place], [place] [address]   Acetaminophen  (Tylenol ), Serum 08/06/2024 <10 (L)  10 - 30 ug/mL Final   Comment: (NOTE) Therapeutic concentrations vary significantly. A range of 10-30 ug/mL  may be an effective concentration for many patients. However, some  are best treated at  concentrations outside of this range. Acetaminophen  concentrations >150 ug/mL at 4 hours after ingestion  and >50 ug/mL at 12 hours after ingestion are often associated with  toxic reactions.  Performed at [facility], [address], [place], [place] [address]    Color, Urine 08/06/2024 STRAW (A)  YELLOW Final   APPearance 08/06/2024 CLEAR  CLEAR Final   Specific Gravity, Urine 08/06/2024 1.003 (L)  1.005 - 1.030 Final   pH 08/06/2024 6.0  5.0 - 8.0 Final   Glucose, UA 08/06/2024 NEGATIVE  NEGATIVE mg/dL Final   Hgb urine dipstick 08/06/2024 SMALL (A)  NEGATIVE Final   Bilirubin Urine 08/06/2024 NEGATIVE  NEGATIVE Final   Ketones, ur 08/06/2024 20 (A)  NEGATIVE mg/dL Final   Protein, ur 91/80/7974 NEGATIVE  NEGATIVE mg/dL Final   Nitrite 91/80/7974 NEGATIVE  NEGATIVE Final   Leukocytes,Ua 08/06/2024 NEGATIVE  NEGATIVE Final   RBC / HPF 08/06/2024 0-5  0 - 5 RBC/hpf Final   WBC, UA 08/06/2024 0-5  0 - 5 WBC/hpf Final   Bacteria, UA 08/06/2024 NONE SEEN  NONE SEEN Final   Squamous Epithelial / HPF 08/06/2024 0-5  0 - 5 /HPF Final   Performed at [facility], [address] [place], [place] [address]  Admission on 07/21/2024, Discharged on 07/23/2024  Component Date Value Ref Range Status   Folate 07/22/2024 14.2  >5.9 ng/mL Final   Performed at [facility], [address], [place], [place] [address]   Cholesterol 07/22/2024 133  0 - 200 mg/dL Final   Triglycerides 91/95/7974 36  <150 mg/dL Final   HDL 91/95/7974 74  >40 mg/dL Final   Total CHOL/HDL Ratio 07/22/2024 1.8  RATIO Final   VLDL 07/22/2024 7  0 - 40 mg/dL Final   LDL Cholesterol 07/22/2024 52  0 - 99 mg/dL Final   Comment:        Total Cholesterol/HDL:CHD Risk Coronary Heart Disease Risk Table                     Men   Women  1/2 Average Risk   3.4   3.3  Average Risk       5.0   4.4  2 X Average Risk   9.6   7.1  3 X Average Risk  23.4   11.0        Use the calculated Patient  Ratio above and the CHD Risk Table to determine the patient's CHD Risk.        ATP III CLASSIFICATION (LDL):  <100     mg/dL   Optimal  899-870  mg/dL   Near or Above                    Optimal  130-159  mg/dL   Borderline  839-810  mg/dL   High  >809     mg/dL   Very High Performed at [facility], [address] [place], [place] [address]    Vitamin B-12 07/22/2024 415  180 - 914 pg/mL Final   Comment: (NOTE) This assay  is not validated for testing neonatal or myeloproliferative syndrome specimens for Vitamin B12 levels. Performed at [facility], [address], [place], [place] [address]    Vit D, 25-Hydroxy 07/22/2024 24.83 (L)  30 - 100 ng/mL Final   Comment: (NOTE) Vitamin D  deficiency has been defined by the Institute of Medicine  and an Endocrine Society practice guideline as a level of serum 25-OH  vitamin D  less than 20 ng/mL (1,2). The Endocrine Society went on to  further define vitamin D  insufficiency as a level between 21 and 29  ng/mL (2).  1. IOM (Institute of Medicine). 2010. Dietary reference intakes for  calcium and D. [place]: [employer]. 2. Holick MF, Binkley [place], Bischoff-Ferrari HA, et al. Evaluation,  treatment, and prevention of vitamin D  deficiency: an Endocrine  Society clinical practice guideline, JCEM. 2011 Jul; 96(7): 1911-30.  Performed at [facility], [address], [place], [place] [address]   Admission on 07/20/2024, Discharged on 07/21/2024  Component Date Value Ref Range Status   WBC 07/20/2024 8.8  4.0 - 10.5 K/uL Final   RBC 07/20/2024 5.16  4.22 - 5.81 MIL/uL Final   Hemoglobin 07/20/2024 15.9  13.0 - 17.0 g/dL Final   HCT 91/97/7974 46.0  39.0 - 52.0 % Final   MCV 07/20/2024 89.1  80.0 - 100.0 fL Final   MCH 07/20/2024 30.8  26.0 - 34.0 pg Final   MCHC 07/20/2024 34.6  30.0 - 36.0 g/dL Final   RDW 91/97/7974 11.6  11.5 - 15.5 % Final   Platelets 07/20/2024 492  (H)  150 - 400 K/uL Final   nRBC 07/20/2024 0.0  0.0 - 0.2 % Final   Neutrophils Relative % 07/20/2024 72  % Final   Neutro Abs 07/20/2024 6.4  1.7 - 7.7 K/uL Final   Lymphocytes Relative 07/20/2024 17  % Final   Lymphs Abs 07/20/2024 1.5  0.7 - 4.0 K/uL Final   Monocytes Relative 07/20/2024 9  % Final   Monocytes Absolute 07/20/2024 0.8  0.1 - 1.0 K/uL Final   Eosinophils Relative 07/20/2024 1  % Final   Eosinophils Absolute 07/20/2024 0.1  0.0 - 0.5 K/uL Final   Basophils Relative 07/20/2024 1  % Final   Basophils Absolute 07/20/2024 0.1  0.0 - 0.1 K/uL Final   Immature Granulocytes 07/20/2024 0  % Final   Abs Immature Granulocytes 07/20/2024 0.03  0.00 - 0.07 K/uL Final   Performed at [facility], [address], [place], [place] [address]   Sodium 07/20/2024 134 (L)  135 - 145 mmol/L Final   Potassium 07/20/2024 2.9 (L)  3.5 - 5.1 mmol/L Final   Chloride 07/20/2024 98  98 - 111 mmol/L Final   CO2 07/20/2024 23  22 - 32 mmol/L Final   Glucose, Bld 07/20/2024 97  70 - 99 mg/dL Final   Glucose reference range applies only to samples taken after fasting for at least 8 hours.   BUN 07/20/2024 8  6 - 20 mg/dL Final   Creatinine, Ser 07/20/2024 0.89  0.61 - 1.24 mg/dL Final   Calcium 91/97/7974 9.9  8.9 - 10.3 mg/dL Final   Total Protein 91/97/7974 7.9  6.5 - 8.1 g/dL Final   Albumin 91/97/7974 5.0  3.5 - 5.0 g/dL Final   AST 91/97/7974 29  15 - 41 U/L Final   ALT 07/20/2024 18  0 - 44 U/L Final   Alkaline Phosphatase 07/20/2024 66  38 - 126 U/L  Final   Total Bilirubin 07/20/2024 2.3 (H)  0.0 - 1.2 mg/dL Final   GFR, Estimated 07/20/2024 >60  >60 mL/min Final   Comment: (NOTE) Calculated using the CKD-EPI Creatinine Equation (2021)    Anion gap 07/20/2024 13  5 - 15 Final   Performed at [facility], [address] [place], [place] [address]   Hgb A1c MFr Bld 07/20/2024 4.6 (L)  4.8 - 5.6 % Final   Comment: (NOTE)         Prediabetes: 5.7 - 6.4         Diabetes:  >6.4         Glycemic control for adults with diabetes: <7.0    Mean Plasma Glucose 07/20/2024 85  mg/dL Final   Comment: (NOTE) Performed At: [facility] [address] [place], [place] [id number] Jennette Shorter MD Ey:[id number]    Alcohol, Ethyl (B) 07/20/2024 <15  <15 mg/dL Final   Comment: (NOTE) For medical purposes only. Performed at [facility], [address] [place], [place] [address]    TSH 07/20/2024 0.630  0.350 - 4.500 uIU/mL Final   Comment: Performed by a 3rd Generation assay with a functional sensitivity of <=0.01 uIU/mL. Performed at [facility], [address] [place], [place] [address]    POC Amphetamine UR 07/20/2024 None Detected  NONE DETECTED (Cut Off Level 1000 ng/mL) Final   POC Secobarbital (BAR) 07/20/2024 None Detected  NONE DETECTED (Cut Off Level 300 ng/mL) Final   POC Buprenorphine (BUP) 07/20/2024 None Detected  NONE DETECTED (Cut Off Level 10 ng/mL) Final   POC Oxazepam (BZO) 07/20/2024 None Detected  NONE DETECTED (Cut Off Level 300 ng/mL) Final   POC Cocaine UR 07/20/2024 None Detected  NONE DETECTED (Cut Off Level 300 ng/mL) Final   POC Methamphetamine UR 07/20/2024 None Detected  NONE DETECTED (Cut Off Level 1000 ng/mL) Final   POC Morphine 07/20/2024 None Detected  NONE DETECTED (Cut Off Level 300 ng/mL) Final   POC Methadone UR 07/20/2024 None Detected  NONE DETECTED (Cut Off Level 300 ng/mL) Final   POC Oxycodone UR 07/20/2024 None Detected  NONE DETECTED (Cut Off Level 100 ng/mL) Final   POC Marijuana UR 07/20/2024 Positive (A)  NONE DETECTED (Cut Off Level 50 ng/mL) Final   Potassium 07/20/2024 2.9 (L)  3.5 - 5.1 mmol/L Final   Performed at [facility], [address] [place], [place] [address]   Potassium 07/21/2024 3.7  3.5 - 5.1 mmol/L Final   Performed at [facility], [address], [place], [place] [address]    Allergies: Fish allergy  Medications:  Facility Ordered Medications  Medication    [COMPLETED] potassium chloride  SA (KLOR-CON  M) CR tablet 40 mEq   acetaminophen  (TYLENOL ) tablet 650 mg   alum & mag hydroxide-simeth (MAALOX/MYLANTA) 200-200-20 MG/5ML suspension 30 mL   magnesium  hydroxide (MILK OF MAGNESIA) suspension 30 mL   haloperidol  (HALDOL ) tablet 5 mg   And   diphenhydrAMINE  (BENADRYL ) capsule 50 mg   haloperidol  lactate (HALDOL ) injection 5 mg   And   diphenhydrAMINE  (BENADRYL ) injection 50 mg   And   LORazepam  (ATIVAN ) injection 2 mg   haloperidol  lactate (HALDOL ) injection 10 mg   And   diphenhydrAMINE  (BENADRYL ) injection 50 mg   And   LORazepam  (ATIVAN ) injection 2 mg   hydrOXYzine  (ATARAX ) tablet 25 mg   melatonin tablet 3 mg   risperiDONE  (RISPERDAL ) tablet 0.5 mg   PTA Medications  Medication Sig   hydrOXYzine  (ATARAX ) 25 MG tablet Take 1 tablet (25 mg total) by mouth at bedtime.   risperiDONE  (RISPERDAL ) 0.5 MG tablet Take 1 tablet (0.5 mg total) by mouth at bedtime.   traZODone  (DESYREL ) 50 MG tablet Take 1 tablet (50 mg total) by mouth at bedtime.   feeding supplement (ENSURE PLUS HIGH PROTEIN) LIQD Take 237 mLs by mouth 2 (two) times daily between meals.   hydrOXYzine  (ATARAX ) 25 MG tablet Take 1 tablet (25 mg total) by mouth 2 (two) times daily as needed for anxiety.   Vitamin D , Ergocalciferol , (DRISDOL ) 1.25 MG (50000 UNIT) CAPS capsule Take 1 capsule (50,000 Units total) by mouth every 7 (seven) days.      Medical Decision Making  Recommend admission to the continuous observation unit for safety monitoring and re-eval in the am for final disposition.  I have reviewed the lab results and medication administered at [facility]: CBC with no leukocytosis, hemoglobin is within normal limits. CMP remarkable for some slight decrease in potassium today at 3.2, patient given oral replacement to help. UA with no nitrites, no leukocytes, no signs of infection. UDS positive for THC.  EKG non-ischemic NSR.  Home medications -Risperdal  0.5 mg PO daily  at bedtime for mood lability/psychosis  Other Prns -Tylenol . Maalox, MOM, Melatonin. Atarax  -agitation protocol medications    Recommendations  Based on my evaluation the patient does not appear to have an emergency medical condition.  Recommend admission to the continuous observation unit for safety monitoring and re-eval in the am for final disposition.   Thurman LULLA Ivans, NP 08/06/24  11:18 PM

## 2024-08-06 NOTE — Discharge Instructions (Addendum)
 You had low potassium on today's visit, this was replaced with 1 single oral dose.  Please go to behavioral health for further management of your ongoing symptoms.

## 2024-08-06 NOTE — ED Provider Notes (Signed)
 [place] EMERGENCY DEPARTMENT AT [facility] Provider Note   CSN: [id number] Arrival date & time: 08/06/24  1758     Patient presents with: Psychiatric Evaluation   Joe Johnston is a 18 y.o. male.   18 y.o male with a PMH of Asthma, G6PD defieicny presents to the ED with a chief complaint of weakness.  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  She reports he missed a couple doses of his medication yesterday, therefore he was given an additional risperidone  0.5 mg yesterday, reports that he got home from school today he appeared to be more anxious therefore he was giving some Atarax .  Patient began to feel very anxious therefore they went to [place] urgent care.  When he arrived to be her, he was noted to appear more weak, dehydrated, rundown therefore he was sent to the emergency department for medical clearance.  Patient is drinking here in the ED, he does not endorse any pain at this time.  He tells me that he does not feel weak.  He denies any SI, HI, visual or auditory hallucinations.  The history is provided by the patient.       Prior to Admission medications   Medication Sig Start Date End Date Taking? Authorizing Provider  feeding supplement (ENSURE PLUS HIGH PROTEIN) LIQD Take 237 mLs by mouth 2 (two) times daily between meals. 07/23/24   Blair, Christal H, NP  hydrOXYzine  (ATARAX ) 25 MG tablet Take 1 tablet (25 mg total) by mouth at bedtime. 07/23/24   Bennett, Christal H, NP  hydrOXYzine  (ATARAX ) 25 MG tablet Take 1 tablet (25 mg total) by mouth 2 (two) times daily as needed for anxiety. 07/23/24   Bennett, Christal H, NP  risperiDONE  (RISPERDAL ) 0.5 MG tablet Take 1 tablet (0.5 mg total) by mouth at bedtime. 07/23/24   Blair, Christal H, NP  traZODone  (DESYREL ) 50 MG tablet Take 1  tablet (50 mg total) by mouth at bedtime. 07/23/24   Bennett, Christal H, NP  Vitamin D , Ergocalciferol , (DRISDOL ) 1.25 MG (50000 UNIT) CAPS capsule Take 1 capsule (50,000 Units total) by mouth every 7 (seven) days. 07/23/24   Kennyth Starleen RAMAN, MD    Allergies: Fish allergy    Review of Systems  Constitutional:  Negative for chills and fever.  HENT:  Negative for sinus pressure.   Respiratory:  Negative for shortness of breath.   Cardiovascular:  Negative for chest pain.  Gastrointestinal:  Negative for nausea and vomiting.  Musculoskeletal:  Negative for back pain.  Neurological:  Positive for weakness. Negative for syncope and headaches.  Psychiatric/Behavioral:  Negative for self-injury, sleep disturbance and suicidal ideas. The patient is not nervous/anxious.   All other systems reviewed and are negative.   Updated Vital Signs BP 131/74 (BP Location: Right Arm)   Pulse (!) 102   Temp 99 F (37.2 C) (Oral)   Resp 16   Ht 5' 10 (1.778 m)   Wt 65.8 kg   SpO2 100%   BMI 20.81 kg/m   Physical Exam Vitals and nursing note reviewed.  Constitutional:      Appearance: He is well-developed.  HENT:     Head: Normocephalic and atraumatic.  Eyes:     General: No scleral icterus.    Pupils: Pupils are equal, round, and reactive to light.  Cardiovascular:  Heart sounds: Normal heart sounds.  Pulmonary:     Effort: Pulmonary effort is normal.     Breath sounds: Normal breath sounds. No wheezing.  Chest:     Chest wall: No tenderness.  Abdominal:     General: Bowel sounds are normal. There is no distension.     Palpations: Abdomen is soft.     Tenderness: There is no abdominal tenderness.  Musculoskeletal:        General: No tenderness or deformity.     Cervical back: Normal range of motion.  Skin:    General: Skin is warm and dry.  Neurological:     Mental Status: He is alert and oriented to person, place, and time.     (all labs ordered are listed, but only abnormal  results are displayed) Labs Reviewed  COMPREHENSIVE METABOLIC PANEL WITH GFR - Abnormal; Notable for the following components:      Result Value   Potassium 3.2 (*)    Glucose, Bld 141 (*)    BUN 5 (*)    Total Bilirubin 1.5 (*)    All other components within normal limits  SALICYLATE LEVEL - Abnormal; Notable for the following components:   Salicylate Lvl <7.0 (*)    All other components within normal limits  ACETAMINOPHEN  LEVEL - Abnormal; Notable for the following components:   Acetaminophen  (Tylenol ), Serum <10 (*)    All other components within normal limits  URINALYSIS, ROUTINE W REFLEX MICROSCOPIC - Abnormal; Notable for the following components:   Color, Urine STRAW (*)    Specific Gravity, Urine 1.003 (*)    Hgb urine dipstick SMALL (*)    Ketones, ur 20 (*)    All other components within normal limits  ETHANOL  CBC WITH DIFFERENTIAL/PLATELET  RAPID URINE DRUG SCREEN, HOSP PERFORMED    EKG: None  Radiology: No results found.   Procedures   Medications Ordered in the ED - No data to display  Clinical Course as of 08/06/24 2021  Tue Aug 06, 2024  2008 Tetrahydrocannabinol(!): POSITIVE [JS]    Clinical Course User Index [JS] Dorene Bruni, PA-C                                 Medical Decision Making Amount and/or Complexity of Data Reviewed Labs: ordered.   This patient presents to the ED for concern of medical clearance, this involves a number of treatment options, and is a complaint that carries with it a high risk of complications and morbidity.  The differential diagnosis includes acute psychosis versus electrolyte derangement versus medication.   Co morbidities: Discussed in HPI   Brief History:  SEE HPI  EMR reviewed including pt PMHx, past surgical history and past visits to ER.   See HPI for more details   Lab Tests:  I ordered and independently interpreted labs.  The pertinent results include:   CBC with no leukocytosis, hemoglobin  is within normal limits.  CMP remarkable for some slight decrease in potassium today at 3.2, given oral replacement to help.  UA with no nitrites, no leukocytes, no signs of infection.  UDS positive for THC.  Imaging Studies:  No imaging studies ordered for this patient  Cardiac Monitoring:  The patient was maintained on a cardiac monitor.  I personally viewed and interpreted the cardiac monitored which showed an underlying rhythm of: NSR  EKG non-ischemic   Medicines ordered:  I ordered medication including  potassium for hypokalemia Reevaluation of the patient after these medicines showed that the patient improved I have reviewed the patients home medicines and have made adjustments as needed  Reevaluation:  After the interventions noted above I re-evaluated patient and found that they have :improved  Social Determinants of Health:  The patient's social determinants of health were a factor in the care of this patient  Problem List / ED Course:  Patient presented the ED with a chief complaint of medical clearance.  Patient was evaluated by me without any complaint at this time.  Behavioral unit they feel that he looks somewhat weak, may be dehydrated.  Here labs are within normal limits.  No leukocytosis, CMP remarkable for mild hypokalemia, they were concerned that likely his potassium was lower today but this was orally replaced.  Patient does appear nontoxic to me, well-appearing.  UDS is positive for THC.  He did have some anxiety which was taking care of by the Atarax  that he received by parents.  Dispostion:  After consideration of the diagnostic results and the patients response to treatment, I feel that the patent would benefit from further care at behavioral unit.    Portions of this note were generated with Scientist, clinical (histocompatibility and immunogenetics). Dictation errors may occur despite best attempts at proofreading.   Final diagnoses:  Medical clearance for psychiatric admission    ED  Discharge Orders     None          Maguadalupe Lata, PA-C 08/06/24 2029    Albertina Dixon, MD 08/07/24 [id number]

## 2024-08-06 NOTE — ED Provider Notes (Signed)
 Behavioral Health Urgent Care Medical Screening Exam  Patient Name: Joe Johnston MRN: [id number] Date of Evaluation: 08/06/24 Chief Complaint: I did something to people through food. Diagnosis:  Final diagnoses:  Catatonia associated with another mental disorder  Lethargic    History of Present illness: Joe Johnston is a 18 y.o. male. Patient with a recent history of drug induced psychosis and was admitted for 2 days and given Risperidone  0.5 mg at bedtime and discharged Aug 5 started having delusional thoughts yesterday and told his mother who spoke to a friend psychiatrist about what was going on. Patient admitted to missing 2 doses of medication. Mother under advice of friend gave patient 1 mg of Risperdal  last night. He woke up fine and went to school. Mother dropped him off at [facility] and then needed to return to give him something and he was asking strange questions about what was going in the class. He asked if he had hurt someone. Last night he asked mother if he had killed his father. When he came home he was anxious and father gave him hydroxyzine  25 mg for anxiety. Because of the delusional thoughts mother decided to bring him to the urgent care. He was walking fine but when they got to the urgent care he started having trouble walking and acting very lethargic. He reports to Clinical research associate that he felt like he did something to the other people in his class through food. He cannot explain what. He knows the date and says he is in the hospital. Mother reports that patient also had this reaction when he was admitted to psychiatry the first time. He was fine and then when he got to the urgent care he started acting different. There is something about this place she says.   Flowsheet Row [facility]20/[facility]24 in [facility]  C-SSRS RISK CATEGORY No Risk No Risk    Psychiatric  Specialty Exam  Presentation  General Appearance:Appropriate for Environment; Casual; Neat; Well Groomed  Eye Contact:Fair  Speech:Slow; Slurred  Speech Volume:Decreased  Handedness:-- (not assessed)   Mood and Affect  Mood: Depressed  Affect: Blunt; Constricted   Thought Process  Thought Processes: Irrevelant  Descriptions of Associations:Loose  Orientation:Full (Time, Place and Person)  Thought Content:Illogical; Delusions; Rumination (thinks someone else's brain could be in his head)  Diagnosis of Schizophrenia or Schizoaffective disorder in past: No   Hallucinations:None -- (Denies)  Ideas of Reference:Delusions  Suicidal Thoughts:No  Homicidal Thoughts:No   Sensorium  Memory: Immediate Good; Recent Good; Remote Good  Judgment: Poor  Insight: Fair; Poor; Shallow   Executive Functions  Concentration: Poor  Attention Span: Poor  Recall: [employer] of Knowledge: Other (comment) (not assessed)  Language: Good   Psychomotor Activity  Psychomotor Activity: Decreased; Flacid   Assets  Assets: Communication Skills; Desire for Improvement; Financial Resources/Insurance; Housing; Physical Health; Social Support; Tax adviser; Talents/Skills; Resilience   Sleep  Sleep: Good  Number of hours: 8   Physical Exam: Physical Exam Constitutional:      Comments: Lethargic  HENT:     Head: Normocephalic and atraumatic.     Right Ear: External ear normal.     Left Ear: External ear normal.     Nose: Nose normal.     Mouth/Throat:     Mouth: Mucous membranes are dry.  Eyes:     Conjunctiva/sclera: Conjunctivae normal.  Pulmonary:     Effort: Pulmonary effort  is normal.  Musculoskeletal:        General: Normal range of motion.     Cervical back: Normal range of motion.  Skin:    General: Skin is warm and dry.  Neurological:     Motor: Weakness present.    Review of Systems  Constitutional:  Negative  for chills and fever.  Respiratory:  Negative for cough.   Cardiovascular:  Negative for chest pain.  Gastrointestinal:  Negative for abdominal pain, heartburn and nausea.  Musculoskeletal:  Negative for falls.  Neurological:  Positive for weakness.  Psychiatric/Behavioral:  Negative for depression, hallucinations, memory loss and substance abuse. The patient is nervous/anxious.    Blood pressure 124/73, pulse 89, temperature 99.4 F (37.4 C), resp. rate 15, SpO2 100%. There is no height or weight on file to calculate BMI.  Musculoskeletal: Strength & Muscle Tone: decreased Gait & Station: unsteady, weak Patient leans: on others to walk   [facility] Discharge Disposition for Follow up and Recommendations: Based on my evaluation the patient appears to have an emergency medical condition for which I recommend the patient be transferred to the emergency department for further evaluation.  Patient may have an unusual reaction to Risperdal  increased dose, may have developed catatonia or have an exaggerated response to Vistaril  but medical condition causing weakness and lethargy cannot be ruled out at this time.  Spoke with Lamar Shan at [facility].  Patient will be transferred back once medical cause for severe lethargy has been assessed.     Garvin JINNY Gaines, MD 08/06/2024, 5:18 PM

## 2024-08-06 NOTE — Progress Notes (Signed)
   08/06/24 1520  [facility] Triage Screening (Walk-ins at [facility] only)  How Did You Hear About Us ? Family/Friend  What Is the Reason for Your Visit/Call Today? PT Joe Johnston 18Y male presents to [facility] accompanied with 2 family members. PT can barely walk and appears under the influence. PT's family members assist him. PT is unable to assess at this time, provider Lawrnce is assessing the PT at this time.  Determination of Need Emergent (2 hours)  Determination of Need filed? Yes

## 2024-08-06 NOTE — ED Notes (Signed)
 Joe Johnston arrived to the [facility] for observation. Patient is A&Ox2 . Patient is oriented to self and situation. Patient is unable to speak with nurse or medical staff. Patient throughout the initial nursing assessment patient would either shake head or nod to answer questions. Patient denies any suicidal ideations or homicidal ideations.  Pt contracts for safety. Patient denies any auditory hallucinations, visual hallucinations, or CAH. Skin check conducted by Joe Johnston and Joe Johnston, MHT. Patient oriented to the unit and provided food, beverage, and snack. Patient nodded his head to indicate understanding. Patient currently resting in bed. No distress noted.

## 2024-08-07 ENCOUNTER — Inpatient Hospital Stay (HOSPITAL_COMMUNITY)
Admission: AD | Admit: 2024-08-07 | Discharge: 2024-08-12 | DRG: 897 | Disposition: A | Discharge status: Home / Self Care | Source: Intra-hospital | Attending: Psychiatry | Admitting: Psychiatry

## 2024-08-07 ENCOUNTER — Other Ambulatory Visit: Payer: Self-pay

## 2024-08-07 ENCOUNTER — Encounter (HOSPITAL_COMMUNITY): Payer: Self-pay

## 2024-08-07 DIAGNOSIS — D75A Glucose-6-phosphate dehydrogenase (G6PD) deficiency without anemia: Secondary | ICD-10-CM | POA: Diagnosis present

## 2024-08-07 DIAGNOSIS — F19959 Other psychoactive substance use, unspecified with psychoactive substance-induced psychotic disorder, unspecified: Secondary | ICD-10-CM | POA: Diagnosis not present

## 2024-08-07 DIAGNOSIS — F12159 Cannabis abuse with psychotic disorder, unspecified: Principal | ICD-10-CM | POA: Diagnosis present

## 2024-08-07 DIAGNOSIS — E876 Hypokalemia: Secondary | ICD-10-CM | POA: Diagnosis present

## 2024-08-07 DIAGNOSIS — F209 Schizophrenia, unspecified: Secondary | ICD-10-CM | POA: Diagnosis not present

## 2024-08-07 DIAGNOSIS — F1914 Other psychoactive substance abuse with psychoactive substance-induced mood disorder: Secondary | ICD-10-CM | POA: Diagnosis present

## 2024-08-07 DIAGNOSIS — F419 Anxiety disorder, unspecified: Secondary | ICD-10-CM | POA: Diagnosis present

## 2024-08-07 DIAGNOSIS — F32A Depression, unspecified: Secondary | ICD-10-CM | POA: Diagnosis present

## 2024-08-07 DIAGNOSIS — Z79899 Other long term (current) drug therapy: Secondary | ICD-10-CM

## 2024-08-07 DIAGNOSIS — F1290 Cannabis use, unspecified, uncomplicated: Secondary | ICD-10-CM | POA: Insufficient documentation

## 2024-08-07 MED ORDER — DIPHENHYDRAMINE HCL 25 MG PO CAPS
50.0000 mg | ORAL_CAPSULE | Freq: Three times a day (TID) | ORAL | Status: DC | PRN
  Administered 2024-08-08: 50 mg via ORAL
  Filled 2024-08-07 (×2): qty 2

## 2024-08-07 MED ORDER — HALOPERIDOL 5 MG PO TABS
5.0000 mg | ORAL_TABLET | Freq: Three times a day (TID) | ORAL | Status: DC | PRN
  Administered 2024-08-08: 5 mg via ORAL
  Filled 2024-08-07: qty 1

## 2024-08-07 MED ORDER — RISPERIDONE 0.5 MG PO TABS
0.5000 mg | ORAL_TABLET | Freq: Every day | ORAL | Status: DC
  Administered 2024-08-07: 0.5 mg via ORAL
  Filled 2024-08-07: qty 1

## 2024-08-07 MED ORDER — ALUM & MAG HYDROXIDE-SIMETH 200-200-20 MG/5ML PO SUSP: 30.0000 mL | ORAL | Status: DC | PRN

## 2024-08-07 MED ORDER — HYDROXYZINE HCL 25 MG PO TABS
25.0000 mg | ORAL_TABLET | Freq: Every day | ORAL | Status: DC
  Administered 2024-08-07 – 2024-08-11 (×5): 25 mg via ORAL
  Filled 2024-08-07 (×5): qty 1
  Filled 2024-08-07: qty 7

## 2024-08-07 MED ORDER — VITAMIN D (ERGOCALCIFEROL) 1.25 MG (50000 UNIT) PO CAPS: 50000.0000 [IU] | ORAL_CAPSULE | ORAL | Status: DC

## 2024-08-07 MED ORDER — MAGNESIUM HYDROXIDE 400 MG/5ML PO SUSP
30.0000 mL | Freq: Every day | ORAL | Status: DC | PRN
Start: 2024-08-07 — End: 2024-08-12

## 2024-08-07 MED ORDER — DIPHENHYDRAMINE HCL 50 MG/ML IJ SOLN: 50.0000 mg | Freq: Three times a day (TID) | INTRAMUSCULAR | Status: DC | PRN

## 2024-08-07 MED ORDER — HALOPERIDOL LACTATE 5 MG/ML IJ SOLN: 5.0000 mg | Freq: Three times a day (TID) | INTRAMUSCULAR | Status: DC | PRN

## 2024-08-07 MED ORDER — ACETAMINOPHEN 325 MG PO TABS: 650.0000 mg | ORAL_TABLET | Freq: Four times a day (QID) | ORAL | Status: DC | PRN

## 2024-08-07 MED ORDER — HALOPERIDOL LACTATE 5 MG/ML IJ SOLN: 10.0000 mg | Freq: Three times a day (TID) | INTRAMUSCULAR | Status: DC | PRN

## 2024-08-07 MED ORDER — HYDROXYZINE HCL 25 MG PO TABS
25.0000 mg | ORAL_TABLET | Freq: Two times a day (BID) | ORAL | Status: DC | PRN
  Administered 2024-08-08: 25 mg via ORAL
  Filled 2024-08-07: qty 1

## 2024-08-07 MED ORDER — LORAZEPAM 2 MG/ML IJ SOLN: 2.0000 mg | Freq: Three times a day (TID) | INTRAMUSCULAR | Status: DC | PRN

## 2024-08-07 NOTE — Discharge Instructions (Addendum)
 Follow-up recommendations:  Activity:  Normal, as tolerated Diet:  Per PCP recommendation  Patient is instructed prior to discharge to:  Take all medications as prescribed by mental healthcare provider. Report any adverse effects and/or reactions from the medicines to outpatient provider promptly. To not engage in substance use while on psychiatric medicines.  In the event of worsening symptoms, patient is instructed to call the crisis hotline at 988, 911, or go to the nearest ED for appropriate evaluation and treatment of symptoms. To follow-up with primary care provider for your other medical issues, concerns and, or healthcare needs.

## 2024-08-07 NOTE — ED Provider Notes (Signed)
 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  Patient was transferred to ED for weakness (couldn't walk) on initial presentation which has appeared to resolved and did not have clear medical component. Pt was transferred back to [facility] same evening.   Subjective:  Patient reports that he is not experiencing any auditory hallucinations or paranoia.  He does report that he feels like he is not at his baseline currently.  Reports that he feels like he is spending too much time around my mom.  Reports that he thinks he needs to go to the hospital because he feels off.  Reports that he has not used cannabis but that he was in a room with other people using cannabis about two days before current presentation and believes he may have gotten second smoked during that encounter.  Attempted to call mother after patient spoke with her this morning to confirm if patient appears to be improving, but she did not answer.   Later in afternoon patient is seen on unit and reports he is feeling restless and anxious that started this afternoon and was asking for medication.   Informed AC that pt and family would prefer [facility] since they know him there. Informed AC that pt would be appropriate for non-BICU level of care at [facility] given that his psychosis if present is not severe and he is interacting appropriately in [facility] which is much more stimulating than units at [facility].   Stay Summary: Patient was able to interact appropriately with others on the unit but was acting somewhat bizarre with questions/statements at times.  Patient was restarted on medications from prior hospital discharge including  Risperdal  0.5 mg at bedtime.  Patient was accepted to [facility].   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 Family Psychiatric History: No family history of primary psychotic disorders.  No other pertinent family history. Social History: Lives at home with parents.  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 Tobacco Cessation:  N/A, patient does not currently use tobacco products  Current Medications:  Current Facility-Administered Medications  Medication Dose Route Frequency Provider Last Rate Last Admin   acetaminophen  (TYLENOL ) tablet 650 mg  650 mg Oral Q6H PRN Onuoha, Chinwendu V, NP       alum & mag hydroxide-simeth (MAALOX/MYLANTA) 200-200-20 MG/5ML suspension 30 mL  30 mL Oral Q4H PRN Onuoha, Chinwendu V, NP       haloperidol  (HALDOL ) tablet 5 mg  5 mg Oral TID PRN Onuoha, Chinwendu V, NP   5 mg at 08/07/24 1601   And   diphenhydrAMINE  (BENADRYL ) capsule 50 mg  50 mg Oral TID PRN Onuoha, Chinwendu V, NP   50 mg at 08/07/24 1601   haloperidol  lactate (HALDOL ) injection 5 mg  5 mg Intramuscular TID PRN Onuoha, Chinwendu V, NP       And   diphenhydrAMINE  (BENADRYL ) injection 50 mg  50 mg Intramuscular TID PRN Onuoha, Chinwendu V, NP       And   LORazepam  (ATIVAN ) injection 2 mg  2 mg Intramuscular TID PRN Onuoha, Chinwendu V, NP  haloperidol  lactate (HALDOL ) injection 10 mg  10 mg Intramuscular TID PRN Onuoha, Chinwendu V, NP       And   diphenhydrAMINE  (BENADRYL ) injection 50 mg  50 mg Intramuscular TID PRN Onuoha, Chinwendu V, NP       And   LORazepam  (ATIVAN ) injection 2 mg  2 mg Intramuscular TID PRN Onuoha, Chinwendu V, NP       hydrOXYzine  (ATARAX ) tablet 25 mg  25 mg Oral TID PRN Onuoha, Chinwendu V, NP   25 mg at 08/06/24 2333   magnesium  hydroxide (MILK OF MAGNESIA) suspension 30 mL  30 mL Oral Daily PRN Onuoha, Chinwendu V, NP        melatonin tablet 3 mg  3 mg Oral QHS PRN Onuoha, Chinwendu V, NP   3 mg at 08/06/24 2318   risperiDONE  (RISPERDAL ) tablet 0.5 mg  0.5 mg Oral QHS Onuoha, Chinwendu V, NP   0.5 mg at 08/06/24 2319   Current Outpatient Medications  Medication Sig Dispense Refill   hydrOXYzine  (ATARAX ) 25 MG tablet Take 1 tablet (25 mg total) by mouth at bedtime. 30 tablet 0   hydrOXYzine  (ATARAX ) 25 MG tablet Take 1 tablet (25 mg total) by mouth 2 (two) times daily as needed for anxiety. 30 tablet 0   risperiDONE  (RISPERDAL ) 0.5 MG tablet Take 1 tablet (0.5 mg total) by mouth at bedtime. 30 tablet 0   Vitamin D , Ergocalciferol , (DRISDOL ) 1.25 MG (50000 UNIT) CAPS capsule Take 1 capsule (50,000 Units total) by mouth every 7 (seven) days. 4 capsule 0   traZODone  (DESYREL ) 50 MG tablet Take 1 tablet (50 mg total) by mouth at bedtime. (Patient not taking: Reported on 08/07/2024) 30 tablet 0    PTA Medications:  Facility Ordered Medications  Medication   [COMPLETED] potassium chloride  SA (KLOR-CON  M) CR tablet 40 mEq   acetaminophen  (TYLENOL ) tablet 650 mg   alum & mag hydroxide-simeth (MAALOX/MYLANTA) 200-200-20 MG/5ML suspension 30 mL   magnesium  hydroxide (MILK OF MAGNESIA) suspension 30 mL   haloperidol  (HALDOL ) tablet 5 mg   And   diphenhydrAMINE  (BENADRYL ) capsule 50 mg   haloperidol  lactate (HALDOL ) injection 5 mg   And   diphenhydrAMINE  (BENADRYL ) injection 50 mg   And   LORazepam  (ATIVAN ) injection 2 mg   haloperidol  lactate (HALDOL ) injection 10 mg   And   diphenhydrAMINE  (BENADRYL ) injection 50 mg   And   LORazepam  (ATIVAN ) injection 2 mg   hydrOXYzine  (ATARAX ) tablet 25 mg   melatonin tablet 3 mg   risperiDONE  (RISPERDAL ) tablet 0.5 mg   [COMPLETED] melatonin 3 MG tablet   [COMPLETED] risperiDONE  (RISPERDAL ) 0.5 MG tablet   PTA Medications  Medication Sig   hydrOXYzine  (ATARAX ) 25 MG tablet Take 1 tablet (25 mg total) by mouth at bedtime.   risperiDONE  (RISPERDAL ) 0.5 MG tablet Take  1 tablet (0.5 mg total) by mouth at bedtime.   hydrOXYzine  (ATARAX ) 25 MG tablet Take 1 tablet (25 mg total) by mouth 2 (two) times daily as needed for anxiety.   Vitamin D , Ergocalciferol , (DRISDOL ) 1.25 MG (50000 UNIT) CAPS capsule Take 1 capsule (50,000 Units total) by mouth every 7 (seven) days.   traZODone  (DESYREL ) 50 MG tablet Take 1 tablet (50 mg total) by mouth at bedtime. (Patient not taking: Reported on 08/07/2024)        No data to display          Flowsheet Row ED from 08/06/2024 in [facility] Most recent reading at 08/06/2024  11:23 PM ED from 08/06/2024 in [facility] Emergency Department at [facility] Most recent reading at 08/06/2024  6:06 PM [facility] (Dis[facility] Most recent reading at 07/21/2024  3:19 AM  C-SSRS RISK CATEGORY Error: Q3, 4, or 5 should not be populated when Q2 is No Low Risk No Risk    Musculoskeletal  Strength & Muscle Tone: within normal limits Gait & Station: normal Patient leans: N/A  Psychiatric Specialty Exam  Presentation  General Appearance:  Fairly Groomed  Eye Contact: Fair  Speech: Slow; Clear and Coherent  Speech Volume: Normal  Handedness: Right   Mood and Affect  Mood: Depressed  Affect: Flat   Thought Process  Thought Processes: Coherent  Descriptions of Associations:Intact  Orientation:Full (Time, Place and Person)  Thought Content:WDL  Diagnosis of Schizophrenia or Schizoaffective disorder in past: No    Hallucinations:Hallucinations: None  Ideas of Reference:None  Suicidal Thoughts:Suicidal Thoughts: No  Homicidal Thoughts:Homicidal Thoughts: No   Sensorium  Memory: Immediate Fair  Judgment: Poor  Insight: Lacking   Executive Functions  Concentration: Fair  Attention Span: Fair  Recall: [employer] of Knowledge: Fair  Language: Fair   Psychomotor Activity  Psychomotor  Activity: Psychomotor Activity: Normal   Assets  Assets: Communication Skills; Desire for Improvement   Sleep  Sleep: Sleep: Engineer, materials Durations: No data on file for Sleeping  Nutritional Assessment (For OBS and FBC admissions only) Has the patient had a weight loss or gain of 10 pounds or more in the last 3 months?: No Has the patient had a decrease in food intake/or appetite?: No Does the patient have dental problems?: No Does the patient have eating habits or behaviors that may be indicators of an eating disorder including binging or inducing vomiting?: No Has the patient recently lost weight without trying?: 0 Has the patient been eating poorly because of a decreased appetite?: 0 Malnutrition Screening Tool Score: 0    Physical Exam  Physical Exam Vitals and nursing note reviewed.  Constitutional:      General: He is not in acute distress.    Appearance: He is well-developed.  HENT:     Head: Normocephalic and atraumatic.  Pulmonary:     Effort: Pulmonary effort is normal. No respiratory distress.  Musculoskeletal:        General: No swelling.  Neurological:     General: No focal deficit present.     Mental Status: He is alert.    Review of Systems  Constitutional:  Negative for fever.  Cardiovascular:  Negative for chest pain and palpitations.  Gastrointestinal:  Negative for constipation, diarrhea, nausea and vomiting.  Neurological:  Negative for dizziness, weakness and headaches.  Psychiatric/Behavioral:         Pt denies extrapyramidal symptoms including dystonia (sudden spastic contractions of muscle groups), parkinsonism (bradykinesia, tremors, rigidity), and akathisia (severe restlessness).    Blood pressure 126/69, pulse 81, temperature 97.8 F (36.6 C), temperature source Oral, resp. rate 16, SpO2 98%. There is no height or weight on file to calculate BMI.  Demographic Factors:  Male and Adolescent or young adult  Loss  Factors: NA  Historical Factors: NA  Risk Reduction Factors:   Living with another person, especially a relative and Positive social support  Continued Clinical Symptoms:  Psychosis that is mild and likely substance-induced.  Cognitive Features That Contribute To Risk:  None    Suicide Risk:  Minimal: No identifiable suicidal ideation.  Patients presenting with no risk factors but with morbid ruminations; may be classified as minimal risk based on the severity of the depressive symptoms.   Plan Of Care/Follow-up recommendations:  Presentation appears most consistent with substance-induced psychosis but residual symptoms from prior presentation could be element of primary psychotic disorder which is now presenting.  Patient does not appear to have prodromal symptoms including social isolation, negative symptoms, delusions, etc.  Patient does appear to still be having some paranoid thought process per collateral animation and is agreeable to going to hospital which makes no sense to allow for further observation and make further medication adjustments.  Of note today patient does not appear to have any catatonic features which was a concern on admission.  Furthermore patient is reporting some restlessness which should be monitored to rule out akathisia given patient received Haldol  5 mg once last evening for agitation protocol and is antipsychotic niave on risperidone  as well.   Disposition: transfer to [facility] after PM shift change  Justino Cornish, MD PGY-2 Psychiatry Resident 08/07/2024, 4:51 PM

## 2024-08-07 NOTE — Progress Notes (Signed)
 Pt has been accepted to [facility] on 08/07/2024 Bed assignment: 404-01  Pt meets inpatient criteria per: Thurman Ivans NP  Attending Physician will be: Dr. Prentis    Report can be called to: Adult unit: [phone]  Pt can arrive after shift change [facility] will update   Care Team Notified: [facility] Cherylynn Ernst RN, Justino Cornish RN, Ryta Ibrahim RN  [place] Brithany Whitworth LCSW-A   08/07/2024 3:46 PM

## 2024-08-07 NOTE — ED Notes (Signed)
 Pt currently watching TV, observed to have used the bathroom. Pt affirms to have eating breakfast, denies physical pain and discomforts. Pt says he feels 'less anxious' compared to how he felt yesterday. Pt continues to display slow movement and speech, pt is generally cooperative. Pt denies si and hi- verbal contract for safety provided.

## 2024-08-07 NOTE — ED Notes (Addendum)
 Pt is pacing back and forth. Pt repeatedly said that his name is Joe Johnston and not Morene. Pt is restless and appears to be mildly confused. PRN Haldol  and Benadryl  administered per order due to mild confusion. Support and encouragement given. Staff will monitor for pt's safety.

## 2024-08-07 NOTE — ED Notes (Signed)
 Patient currently sleeping and resting in recliner. RR even and unlabored, appearing in no noted distress. Environmental check complete

## 2024-08-07 NOTE — ED Notes (Signed)
 Pt observed/assessed pacing around in flex, from recliner, to doors of flex, back to recliner. Patient redirected. RR even and unlabored, appearing in no noted distress. PRN medication given.

## 2024-08-08 MED ORDER — HYDROXYZINE HCL 25 MG PO TABS
25.0000 mg | ORAL_TABLET | Freq: Three times a day (TID) | ORAL | Status: DC | PRN
  Administered 2024-08-08 – 2024-08-09 (×2): 25 mg via ORAL
  Filled 2024-08-08 (×2): qty 1

## 2024-08-08 MED ORDER — RISPERIDONE 0.5 MG PO TABS
0.5000 mg | ORAL_TABLET | Freq: Two times a day (BID) | ORAL | Status: DC
Start: 2024-08-08 — End: 2024-08-09
  Administered 2024-08-08 – 2024-08-09 (×2): 0.5 mg via ORAL
  Filled 2024-08-08 (×2): qty 1

## 2024-08-08 MED ORDER — VITAMIN D (ERGOCALCIFEROL) 1.25 MG (50000 UNIT) PO CAPS
50000.0000 [IU] | ORAL_CAPSULE | ORAL | Status: DC
  Administered 2024-08-09: 50000 [IU] via ORAL
  Filled 2024-08-08: qty 1

## 2024-08-08 NOTE — Group Note (Unsigned)
 Date:  08/09/2024 Time:  4:24 AM  Group Topic/Focus:  Wrap-Up Group:   The focus of this group is to help patients review their daily goal of treatment and discuss progress on daily workbooks.    Participation Level:  Minimal  Participation Quality:  Appropriate and Sharing  Affect:  Appropriate and Flat  Cognitive:  Appropriate  Insight: Appropriate and Limited  Engagement in Group:  Limited  Modes of Intervention:  Activity and Socialization  Additional Comments:  Patient shared that he had a good day and he was chilling. Patient shared that he spoke with his treatment team today. Patient could not provide a rate for day or a goal when asked. Patient did ask to leave after group after sharing and did not stay the entire time.   Joe Johnston 08/09/2024, 4:24 AM

## 2024-08-08 NOTE — Plan of Care (Signed)
   Problem: Education: Goal: Emotional status will improve Outcome: Not Progressing Goal: Mental status will improve Outcome: Not Progressing   Problem: Activity: Goal: Interest or engagement in activities will improve Outcome: Not Progressing

## 2024-08-08 NOTE — [facility] Suicide Risk Assessment (Signed)
 Suicide Risk Assessment  Admission Assessment    [facility] Admission Suicide Risk Assessment   Nursing information obtained from:  Patient  Demographic factors:  Male, Adolescent or young adult  Current Mental Status:  Suicidal ideation indicated by patient, Self-harm thoughts  Loss Factors:  NA  Historical Factors:  NA  Risk Reduction Factors:  Living with another person, especially a relative, Positive social support  Total Time spent with patient: 1.5 hours  Principal Problem: Psychoactive substance-induced psychosis (HCC)  Diagnosis:  Principal Problem:   Psychoactive substance-induced psychosis (HCC)  Subjective Data: See H&P.  Continued Clinical Symptoms:  Alcohol Use Disorder Identification Test Final Score (AUDIT): 0 The Alcohol Use Disorders Identification Test, Guidelines for Use in Primary Care, Second Edition.  World Science writer [facility]). Score between 0-7:  no or low risk or alcohol related problems. Score between 8-15:  moderate risk of alcohol related problems. Score between 16-19:  high risk of alcohol related problems. Score 20 or above:  warrants further diagnostic evaluation for alcohol dependence and treatment.  CLINICAL FACTORS:   Alcohol/Substance Abuse/Dependencies More than one psychiatric diagnosis Unstable or Poor Therapeutic Relationship Previous Psychiatric Diagnoses and Treatments  Musculoskeletal: Strength & Muscle Tone: within normal limits Gait & Station: normal Patient leans: N/A  Psychiatric Specialty Exam:  Presentation  General Appearance:  Fairly Groomed  Eye Contact: Fair  Speech: Slow; Clear and Coherent  Speech Volume: Normal  Handedness: Right   Mood and Affect  Mood: Depressed  Affect: Flat   Thought Process  Thought Processes: Coherent  Descriptions of Associations:Intact  Orientation:Full (Time, Place and Person)  Thought Content:WDL  History of Schizophrenia/Schizoaffective  disorder:No  Duration of Psychotic Symptoms:N/A  Hallucinations:No data recorded Ideas of Reference:None  Suicidal Thoughts:No data recorded Homicidal Thoughts:No data recorded  Sensorium  Memory: Immediate Fair  Judgment: Poor  Insight: Lacking   Executive Functions  Concentration: Fair  Attention Span: Fair  Recall: [employer] of Knowledge: Fair  Language: Fair   Psychomotor Activity  Psychomotor Activity:No data recorded  Assets  Assets: Communication Skills; Desire for Improvement   Sleep  Sleep:No data recorded   Physical/Ros Exam:  Blood pressure 112/61, pulse 74, temperature 98.1 F (36.7 C), temperature source Oral, resp. rate 18, height 5' 10 (1.778 m), weight 67.2 kg, SpO2 100%. Body mass index is 21.26 kg/m.   COGNITIVE FEATURES THAT CONTRIBUTE TO RISK:  Loss of executive function    SUICIDE RISK:   Moderate:  Frequent suicidal ideation with limited intensity, and duration, some specificity in terms of plans, no associated intent, good self-control, limited dysphoria/symptomatology, some risk factors present, and identifiable protective factors, including available and accessible social support.  PLAN OF CARE: See H&P.  I certify that inpatient services furnished can reasonably be expected to improve the patient's condition.   Mac Bolster, NP, pmhnp, fnp-bc. 08/08/2024, 10:31 AM

## 2024-08-08 NOTE — H&P (Signed)
 Psychiatric Admission Assessment Adult  Patient Identification: Joe Johnston  MRN:  [id number]  Date of Evaluation:  08/08/2024  Chief Complaint:    Principal Diagnosis: Psychoactive substance-induced psychosis (HCC)  Diagnosis:  Principal Problem:   Psychoactive substance-induced psychosis (HCC)  History of Present Illness: This is the second psychiatric admission/treatments in this [facility] for this 18 year old AA male. Joe Johnston was a patient in this [facility] from 07-20-24 to 07-23-24 for mood stabilization treatments. He was stabilized & discharged on medications with recommendations for a routine outpatient psychiatric follow-up care, medication management & counseling services. Joe Johnston is being re-admitted to the [facility] this time around from [facility] with complaint of delusional thoughts/thinking such as asking his mother strange questions about (what was going on in his class. He asked if he had hurt someone. He apparently asked his mother if he had killed his father).These strange questions/bizarre behaviors led to this patient being taken to an urgent care to be checked out. While at the urgent care, patient complained of weakness & was taken to the [facility] for medical evaluation. After medical evaluation/clearance, patient was recommended for inpatient psychiatric evaluation/treatments. A review of his current UDS results showed that he was positive for cannabis. During this evaluation, Joe Johnston reports,   The ambulance took me to the hospital yesterday.  I do not know why I was at a hospital, this may be because I had tried to kill myself because I did not want to be at the other place. I am on medications at home, risperidone  and hydroxyzine . I think I take them for anxiety, but not all time. I had missed a dose here & there. I was discharged from this hospital a couple of weeks ago. But I do not know why I was even in this hospital that time. I think I do have an outpatient psychiatric  provider. Patient current denies any SIHI, AVH, delusional thoughts or paranoia. Although does not appear to be responding to any internal stimuli, he presents disorganized & tangential. He is currently not a good historian.   Associated Signs/Symptoms:  Depression Symptoms:  insomnia, difficulty concentrating, anxiety, decreased appetite,  (Hypo) Manic Symptoms:  Impulsivity, Labiality of Mood,  Anxiety Symptoms:  Excessive Worry,  Psychotic Symptoms:  Patient currently denies any AVH, delusional thoughts or paranoia.He does not appear to be responding to any internal stimuli.  PTSD Symptoms: Denies  Total Time spent with patient: 1.5 hours  Past Psychiatric History:  Previous Psych Diagnoses: None Prior inpatient treatment: None Current/prior outpatient treatment: None Prior rehab tx: None Psychotherapy tx: None History of suicide: Denies History of homicide: Denies Psychiatric medication history: None Psychiatric medication compliance history: N/A Neuromodulation history: None Current Psychiatrist: None Current therapist: None  Substance Abuse Hx: Daily THC use via pens and edibles. First THC edible two weeks ago led to paranoia and hallucinations Alcohol: Occasional use at social events. Last use in April (boxed drink on birthday) Tobacco: Denies use Illicit drugs: Denies other illicit drug use. Rx drug abuse: Denies Rehab: None  Past Medical History: Medical Diagnoses: History of childhood asthma (resolved). Home Rx: None Prior Hosp: None reported Prior Surgeries/Trauma: Left knee meniscus surgery in May after basketball injury. Head trauma, LOC, concussions, seizures: Denies all Allergies: Fish LMP: N/A Contraception: N/A PCP: None reported  Psych Family History: Denies any psychiatric diagnoses in family SA/HA: Denies any suicide attempts or completions in family Substance use family hx: Maternal uncle with alcohol use disorder  Social History:  Patient  Childhood:  Stable. Close relationship with mother Abuse: Denies emotional, physical, or sexual abuse Marital Status: Single Sexual orientation: Heterosexual Children: None Employment: [employer] (flexible schedule). 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 No.  Has the patient been a risk to self in the past 6 months? No.  Has the patient been a risk to self within the distant past? No.  Is the patient a risk to others? No.  Has the patient been a risk to others in the past 6 months? No.  Has the patient been a risk to others within the distant past? No.   [place] Scale:  Flowsheet Row [facility] (Current[facility] Most recent reading at 08/07/2024  9:41 PM ED from 08/06/2024 in [facility] Most recent reading at 08/06/2024 11:23 PM ED from 08/06/2024 in [facility] Emergency Department at [facility] Most recent reading at 08/06/2024  6:06 PM  C-SSRS RISK CATEGORY Low Risk Error: Q3, 4, or 5 should not be populated when Q2 is No Low Risk   Prior Inpatient Therapy: Yes.   If yes, describe: [facility]  Prior Outpatient Therapy: Yes.   If yes, describe Denies  Alcohol Screening: 1. How often do you have a drink containing alcohol?: Never 2. How many drinks containing alcohol do you have on a typical day when you are drinking?: 1 or 2 3. How often do you have six or more drinks on one occasion?: Never AUDIT-C Score: 0 4. How often during the last year have you found that you were not able to stop drinking once you had started?: Never 5. How often during the last year have you failed to do what was normally expected from you because of  drinking?: Never 6. How often during the last year have you needed a first drink in the morning to get yourself going after a heavy drinking session?: Never 7. How often during the last year have you had a feeling of guilt of remorse after drinking?: Never 8. How often during the last year have you been unable to remember what happened the night before because you had been drinking?: Never 9. Have you or someone else been injured as a result of your drinking?: No 10. Has a relative or friend or a doctor or another health worker been concerned about your drinking or suggested you cut down?: No Alcohol Use Disorder Identification Test Final Score (AUDIT): 0 Alcohol Brief Interventions/Follow-up: Alcohol education/Brief advice  Substance Abuse History in the last 12 months:  Yes.    Consequences of Substance Abuse: Discussed with patient during this admission evaluation. Medical Consequences:  Liver damage, Possible death by overdose Legal Consequences:  Arrests, jail time, Loss of driving privilege. Family Consequences:  Family discord, divorce and or separation.  Previous Psychotropic Medications: No   Psychological Evaluations: No   Past Medical History:  Past Medical History:  Diagnosis Date   Asthma    G6PD deficiency     Past Surgical History:  Procedure Laterality Date   MYRINGOTOMY WITH TUBE PLACEMENT     Family History: History reviewed. No pertinent family history.  Family Psychiatric  History: As listed above  Tobacco Screening:  Social History   Tobacco Use  Smoking Status Some Days  Types: Cigarettes  Smokeless Tobacco Not on file    BH Tobacco Counseling     Are you interested in Tobacco Cessation Medications?  No, patient refused Counseled patient on smoking cessation:  Yes Reason Tobacco Screening Not Completed: No value filed.       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 Social  History   Substance and Sexual Activity  Alcohol Use Not Currently   Comment: patient uses alcohol occasionally     Social History   Substance and Sexual Activity  Drug Use Yes   Frequency: 4.0 times per week   Types: Marijuana   Comment: Uses 3/4 times per week    Additional Social History: Marital status: Single Are you sexually active?: No What is your sexual orientation?: Heterosexual Has your sexual activity been affected by drugs, alcohol, medication, or emotional stress?: No Does patient have children?: No  Allergies:   Allergies  Allergen Reactions   Fish Allergy    Lab Results:  Results for orders placed or performed during the hospital encounter of 08/06/24 (from the past 48 hours)  Comprehensive metabolic panel     Status: Abnormal   Collection Time: 08/06/24  6:19 PM  Result Value Ref Range   Sodium 136 135 - 145 mmol/L   Potassium 3.2 (L) 3.5 - 5.1 mmol/L   Chloride 102 98 - 111 mmol/L   CO2 22 22 - 32 mmol/L   Glucose, Bld 141 (H) 70 - 99 mg/dL    Comment: Glucose reference range applies only to samples taken after fasting for at least 8 hours.   BUN 5 (L) 6 - 20 mg/dL   Creatinine, Ser 9.05 0.61 - 1.24 mg/dL   Calcium 9.5 8.9 - 89.6 mg/dL   Total Protein 7.0 6.5 - 8.1 g/dL   Albumin 4.5 3.5 - 5.0 g/dL   AST 22 15 - 41 U/L   ALT 14 0 - 44 U/L   Alkaline Phosphatase 55 38 - 126 U/L   Total Bilirubin 1.5 (H) 0.0 - 1.2 mg/dL   GFR, Estimated >39 >39 mL/min    Comment: (NOTE) Calculated using the CKD-EPI Creatinine Equation (2021)    Anion gap 12 5 - 15    Comment: Performed at [facility], [address] [place], [place] [address]  Ethanol     Status: None   Collection Time: 08/06/24  6:19 PM  Result Value Ref Range   Alcohol, Ethyl (B) <15 <15 mg/dL    Comment: (NOTE) For medical purposes only. Performed at [facility], [address], [place], [place] [address]   CBC with Diff     Status: None   Collection Time: 08/06/24  6:19  PM  Result Value Ref Range   WBC 6.7 4.0 - 10.5 K/uL   RBC 4.51 4.22 - 5.81 MIL/uL   Hemoglobin 14.0 13.0 - 17.0 g/dL   HCT 59.1 60.9 - 47.9 %   MCV 90.5 80.0 - 100.0 fL   MCH 31.0 26.0 - 34.0 pg   MCHC 34.3 30.0 - 36.0 g/dL   RDW 88.0 88.4 - 84.4 %   Platelets 369 150 - 400 K/uL   nRBC 0.0 0.0 - 0.2 %   Neutrophils Relative % 70 %   Neutro Abs 4.7 1.7 - 7.7 K/uL   Lymphocytes Relative 21 %   Lymphs Abs 1.4 0.7 - 4.0 K/uL   Monocytes Relative 7 %  Monocytes Absolute 0.5 0.1 - 1.0 K/uL   Eosinophils Relative 1 %   Eosinophils Absolute 0.0 0.0 - 0.5 K/uL   Basophils Relative 1 %   Basophils Absolute 0.0 0.0 - 0.1 K/uL   Immature Granulocytes 0 %   Abs Immature Granulocytes 0.02 0.00 - 0.07 K/uL    Comment: Performed at [facility], [address] [place], [place] [address]  Salicylate level     Status: Abnormal   Collection Time: 08/06/24  6:19 PM  Result Value Ref Range   Salicylate Lvl <7.0 (L) 7.0 - 30.0 mg/dL    Comment: Performed at [facility], [address], [place], [place] [address]  Acetaminophen  level     Status: Abnormal   Collection Time: 08/06/24  6:19 PM  Result Value Ref Range   Acetaminophen  (Tylenol ), Serum <10 (L) 10 - 30 ug/mL    Comment: (NOTE) Therapeutic concentrations vary significantly. A range of 10-30 ug/mL  may be an effective concentration for many patients. However, some  are best treated at concentrations outside of this range. Acetaminophen  concentrations >150 ug/mL at 4 hours after ingestion  and >50 ug/mL at 12 hours after ingestion are often associated with  toxic reactions.  Performed at [facility], [address] [place], [place] [address]   Urine rapid drug screen (hosp performed)     Status: Abnormal   Collection Time: 08/06/24  6:43 PM  Result Value Ref Range   Opiates NONE DETECTED NONE DETECTED   Cocaine NONE DETECTED NONE DETECTED   Benzodiazepines NONE DETECTED NONE DETECTED   Amphetamines NONE  DETECTED NONE DETECTED   Tetrahydrocannabinol POSITIVE (A) NONE DETECTED   Barbiturates NONE DETECTED NONE DETECTED    Comment: (NOTE) DRUG SCREEN FOR MEDICAL PURPOSES ONLY.  IF CONFIRMATION IS NEEDED FOR ANY PURPOSE, NOTIFY LAB WITHIN 5 DAYS.  LOWEST DETECTABLE LIMITS FOR URINE DRUG SCREEN Drug Class                     Cutoff (ng/mL) Amphetamine and metabolites    1000 Barbiturate and metabolites    200 Benzodiazepine                 200 Opiates and metabolites        300 Cocaine and metabolites        300 THC                            50 Performed at [facility], [address], [place], [place] [address]   Urinalysis, Routine w reflex microscopic -Urine, Clean Catch     Status: Abnormal   Collection Time: 08/06/24  6:43 PM  Result Value Ref Range   Color, Urine STRAW (A) YELLOW   APPearance CLEAR CLEAR   Specific Gravity, Urine 1.003 (L) 1.005 - 1.030   pH 6.0 5.0 - 8.0   Glucose, UA NEGATIVE NEGATIVE mg/dL   Hgb urine dipstick SMALL (A) NEGATIVE   Bilirubin Urine NEGATIVE NEGATIVE   Ketones, ur 20 (A) NEGATIVE mg/dL   Protein, ur NEGATIVE NEGATIVE mg/dL   Nitrite NEGATIVE NEGATIVE   Leukocytes,Ua NEGATIVE NEGATIVE   RBC / HPF 0-5 0 - 5 RBC/hpf   WBC, UA 0-5 0 - 5 WBC/hpf   Bacteria, UA NONE SEEN NONE SEEN   Squamous Epithelial / HPF 0-5 0 - 5 /HPF    Comment: Performed at [facility],  [address] [place], [place] [address]   Blood Alcohol level:  Lab Results  Component Value Date   [facility] <15 08/06/2024   ETH <15 07/20/2024   Metabolic Disorder Labs:  Lab Results  Component Value Date   HGBA1C 4.6 (L) 07/20/2024   MPG 85 07/20/2024   No results found for: PROLACTIN Lab Results  Component Value Date   CHOL 133 07/22/2024   TRIG 36 07/22/2024   HDL 74 07/22/2024   CHOLHDL 1.8 07/22/2024   VLDL 7 07/22/2024   LDLCALC 52 07/22/2024   Current Medications: Current Facility-Administered Medications  Medication Dose Route Frequency  Provider Last Rate Last Admin   acetaminophen  (TYLENOL ) tablet 650 mg  650 mg Oral Q6H PRN McCarty, Artie, MD       alum & mag hydroxide-simeth (MAALOX/MYLANTA) 200-200-20 MG/5ML suspension 30 mL  30 mL Oral Q4H PRN McCarty, Artie, MD       haloperidol  (HALDOL ) tablet 5 mg  5 mg Oral TID PRN McCarty, Artie, MD   5 mg at 08/08/24 1201   And   diphenhydrAMINE  (BENADRYL ) capsule 50 mg  50 mg Oral TID PRN McCarty, Artie, MD   50 mg at 08/08/24 1201   haloperidol  lactate (HALDOL ) injection 5 mg  5 mg Intramuscular TID PRN McCarty, Artie, MD       And   diphenhydrAMINE  (BENADRYL ) injection 50 mg  50 mg Intramuscular TID PRN McCarty, Artie, MD       And   LORazepam  (ATIVAN ) injection 2 mg  2 mg Intramuscular TID PRN McCarty, Artie, MD       haloperidol  lactate (HALDOL ) injection 10 mg  10 mg Intramuscular TID PRN McCarty, Artie, MD       And   diphenhydrAMINE  (BENADRYL ) injection 50 mg  50 mg Intramuscular TID PRN McCarty, Artie, MD       And   LORazepam  (ATIVAN ) injection 2 mg  2 mg Intramuscular TID PRN McCarty, Artie, MD       hydrOXYzine  (ATARAX ) tablet 25 mg  25 mg Oral QHS McCarty, Artie, MD   25 mg at 08/07/24 2137   hydrOXYzine  (ATARAX ) tablet 25 mg  25 mg Oral BID PRN McCarty, Artie, MD   25 mg at 08/08/24 0803   magnesium  hydroxide (MILK OF MAGNESIA) suspension 30 mL  30 mL Oral Daily PRN McCarty, Artie, MD       risperiDONE  (RISPERDAL ) tablet 0.5 mg  0.5 mg Oral BID Parker, Alvin S, MD   0.5 mg at 08/08/24 1201   [START ON 08/09/2024] Vitamin D  (Ergocalciferol ) (DRISDOL ) 1.25 MG (50000 UNIT) capsule 50,000 Units  50,000 Units Oral Q7 days Parker, Alvin S, MD       PTA Medications: Medications Prior to Admission  Medication Sig Dispense Refill Last Dose/Taking   hydrOXYzine  (ATARAX ) 25 MG tablet Take 1 tablet (25 mg total) by mouth at bedtime. 30 tablet 0    hydrOXYzine  (ATARAX ) 25 MG tablet Take 1 tablet (25 mg total) by mouth 2 (two) times daily as needed for anxiety. 30 tablet 0     risperiDONE  (RISPERDAL ) 0.5 MG tablet Take 1 tablet (0.5 mg total) by mouth at bedtime. 30 tablet 0    traZODone  (DESYREL ) 50 MG tablet Take 1 tablet (50 mg total) by mouth at bedtime. (Patient not taking: Reported on 08/07/2024) 30 tablet 0    Vitamin D , Ergocalciferol , (DRISDOL ) 1.25 MG (50000 UNIT) CAPS capsule Take 1 capsule (50,000 Units total) by mouth every 7 (seven) days. 4 capsule  0    AIMS:  ,  ,  ,  ,  ,  ,    Musculoskeletal: Strength & Muscle Tone: within normal limits Gait & Station: normal Patient leans: N/A  Psychiatric Specialty Exam:  Presentation  General Appearance:  Casual; Fairly Groomed  Eye Contact: Fair  Speech: Clear and Coherent; Slow  Speech Volume: Decreased  Handedness:Right  Mood and Affect  Mood: Depressed  Affect: Congruent  Thought Process  Thought Processes: Coherent; Disorganized  Duration of Psychotic Symptoms:N/A  Past Diagnosis of Schizophrenia or Psychoactive disorder: No  Descriptions of Associations:Tangential  Orientation:Partial  Thought Content:Tangential  Hallucinations:Hallucinations: None Description of Auditory Hallucinations: NA   Ideas of Reference:None  Suicidal Thoughts:Suicidal Thoughts: No  Homicidal Thoughts:Homicidal Thoughts: No  Sensorium  Memory: Immediate Poor; Recent Fair; Remote Fair  Judgment: Impaired  Insight: Fair  Art therapist  Concentration: Good  Attention Span: Good  Recall: Good  Fund of Knowledge: Poor  Language: Fair  Psychomotor Activity  Psychomotor Activity: Psychomotor Activity: Normal   Assets  Assets: Desire for Improvement; Financial Resources/Insurance; Housing; Resilience; Social Support  Sleep  Sleep: Sleep: Good   Estimated Sleeping Duration (Last 24 Hours): 6.75-7.75 hours  Physical Exam: Physical Exam Vitals and nursing note reviewed.  Constitutional:      General: He is not in acute distress.    Comments: Thin-framed.   HENT:     Head: Normocephalic.     Nose: Nose normal.     Mouth/Throat:     Pharynx: Oropharynx is clear.  Cardiovascular:     Rate and Rhythm: Normal rate.     Pulses: Normal pulses.  Pulmonary:     Effort: Pulmonary effort is normal.  Genitourinary:    Comments: Deferred Musculoskeletal:        General: Normal range of motion.     Cervical back: Normal range of motion.  Skin:    General: Skin is dry.  Neurological:     General: No focal deficit present.     Mental Status: He is alert and oriented to person, place, and time.    Review of Systems  Constitutional:  Negative for chills, diaphoresis and fever.  HENT:  Negative for congestion.   Respiratory:  Negative for cough, shortness of breath and wheezing.   Cardiovascular:  Negative for chest pain and palpitations.  Gastrointestinal:  Negative for abdominal pain, constipation, diarrhea, heartburn, nausea and vomiting.  Genitourinary:  Negative for dysuria.  Musculoskeletal:  Negative for joint pain and myalgias.  Skin:  Negative for rash.  Neurological:  Negative for dizziness, tingling, tremors, sensory change, speech change, focal weakness, seizures, loss of consciousness, weakness and headaches.  Endo/Heme/Allergies:        Allergies: Fish.  Psychiatric/Behavioral:  Positive for substance abuse. Negative for depression, hallucinations, memory loss and suicidal ideas. The patient is nervous/anxious and has insomnia.    Blood pressure 112/61, pulse 74, temperature 98.1 F (36.7 C), temperature source Oral, resp. rate 18, height 5' 10 (1.778 m), weight 67.2 kg, SpO2 100%. Body mass index is 21.26 kg/m.  Treatment Plan Summary: Daily contact with patient to assess and evaluate symptoms and progress in treatment and Medication management  Principal/active diagnoses.  Psychoactive substance-induced psychosis.  Hx. Cannabis use disorder.  R/o schizophrenia.  Plan: The risks/benefits/side-effects/alternatives to the  medications in use were discussed in detail with the patient and time was given for patient's questions. The patient consents to medication trial.   -Continue Risperdal  0.5 mg po bid for mood control.  -  Vitamin 50,000 units po Q 7 days for bone health.  -Continue Hydroxyzine  25 mg po tid prn for anxiety.  Agitation protocols.  -Continue as recommended (See MAR).  Other PRNS -Continue Tylenol  650 mg every 6 hours PRN for mild pain -Continue Maalox 30 ml Q 4 hrs PRN for indigestion -Continue MOM 30 ml po Q 6 hrs for constipation  Safety and Monitoring: Voluntary admission to inpatient psychiatric unit for safety, stabilization and treatment Daily contact with patient to assess and evaluate symptoms and progress in treatment Patient's case to be discussed in multi-disciplinary team meeting Observation Level : q15 minute checks Vital signs: q12 hours Precautions: Safety  Discharge Planning: Social work and case management to assist with discharge planning and identification of hospital follow-up needs prior to discharge Estimated LOS: 5-7 days Discharge Concerns: Need to establish a safety plan; Medication compliance and effectiveness Discharge Goals: Return home with outpatient referrals for mental health follow-up including medication management/psychotherapy  Observation Level/Precautions:  15 minute checks  Laboratory:  Current lab results reviewed.  Psychotherapy: Enrolled in the group sessions.  Medications: See MAR  Consultations: As needed  Discharge Concerns: Safety, mood stability, maintaining sobriety.  Estimated LOS: 3-5 days.  Other: N/A   Physician Treatment Plan for Primary Diagnosis: Psychoactive substance-induced psychosis (HCC) Long Term Goal(s): Improvement in symptoms so as ready for discharge  Short Term Goals: Ability to identify changes in lifestyle to reduce recurrence of condition will improve, Ability to verbalize feelings will improve, Ability to  disclose and discuss suicidal ideas, and Ability to demonstrate self-control will improve  Physician Treatment Plan for Secondary Diagnosis: Principal Problem:   Psychoactive substance-induced psychosis (HCC)  Long Term Goal(s): Improvement in symptoms so as ready for discharge  Short Term Goals: Ability to identify and develop effective coping behaviors will improve, Ability to maintain clinical measurements within normal limits will improve, Compliance with prescribed medications will improve, and Ability to identify triggers associated with substance abuse/mental health issues will improve  I certify that inpatient services furnished can reasonably be expected to improve the patient's condition.    Mac Bolster, NP, pmhnp, fnp-bc. 8/21/20251:16 PM

## 2024-08-08 NOTE — Progress Notes (Signed)
   08/08/24 1500  Psych Admission Type (Psych Patients Only)  Admission Status Voluntary  Psychosocial Assessment  Patient Complaints Depression;Hopelessness  Eye Contact Fair  Facial Expression Flat  Affect Preoccupied  Speech Logical/coherent  Interaction Guarded;Minimal  Motor Activity Slow  Appearance/Hygiene Unremarkable  Behavior Characteristics Cooperative;Guarded  Mood Depressed  Thought Process  Coherency Blocking  Content Preoccupation;Paranoia  Delusions Paranoid  Perception Derealization  Hallucination None reported or observed  Judgment Impaired  Confusion Mild  Danger to Self  Current suicidal ideation? Denies  Description of Suicide Plan No Plan  Agreement Not to Harm Self Yes  Description of Agreement Verbal Contract  Danger to Others  Danger to Others None reported or observed
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
## 2024-08-08 NOTE — Group Note (Signed)
 LCSW Group Therapy Note   Group Date: 08/08/2024 Start Time: 1100 End Time: 1200  Participation: Attended. Patient attended group and left but returned towards end of session. Patient participated minimally but actively listened.   Type of Therapy:  Group Therapy  Topic: Stronger Together: Building Healthy Relationships  Objective: To explore loneliness, boundaries, and safe ways to build relationships.   Goals:  1. Recognize healthy vs. unhealthy relationships.  2. Learn safe ways to connect with others.  3. Strengthen communication and boundary-setting skills.   Summary: Participants discussed loneliness, healthy connections, and setting boundaries. They explored safe ways to meet people and shared personal experiences. Key insights were reinforced through discussion and quotes.   Therapeutic Modalities Used:   Cognitive Behavioral Therapy (CBT) Elements - Identifying unhealthy relationship patterns, challenging negative thoughts about connection.   Dialectical Behavior Therapy (DBT) Elements - Interpersonal effectiveness, setting and maintaining boundaries.   Supportive Group Therapy - Peer discussion, shared experiences, and emotional validation.  Louetta Lame, LCSWA 08/08/2024  1:31 PM

## 2024-08-08 NOTE — Progress Notes (Signed)
   08/08/24 2015  Psych Admission Type (Psych Patients Only)  Admission Status Voluntary  Psychosocial Assessment  Patient Complaints Confusion;Restlessness  Eye Contact Fair  Facial Expression Flat  Affect Appropriate to circumstance  Speech Incoherent  Interaction Guarded;Minimal  Motor Activity Other (Comment) (WNL)  Appearance/Hygiene In scrubs  Behavior Characteristics Guarded  Mood Suspicious;Apprehensive  Thought Process  Coherency Blocking  Content Preoccupation;Paranoia  Delusions Paranoid  Perception Derealization  Hallucination None reported or observed  Judgment Poor  Confusion Mild  Danger to Self  Current suicidal ideation?  (Denies)  Agreement Not to Harm Self Yes  Description of Agreement Notify Staff  Danger to Others  Danger to Others None reported or observed

## 2024-08-08 NOTE — [facility] Suicide Risk Assessment (Addendum)
 [facility] INPATIENT:  Family/Significant Other Suicide Prevention Education  Suicide Prevention Education:  Education Completed; Mother, Hardin Poche [phone],  (name of family member/significant other) has been identified by the patient as the family member/significant other with whom the patient will be residing, and identified as the person(s) who will aid the patient in the event of a mental health crisis (suicidal ideations/suicide attempt).  With written consent from the patient, the family member/significant other has been provided the following suicide prevention education, prior to the and/or following the discharge of the patient.  Since pt left [facility] a couple weeks ago, pt was doing very well. Mom was administering his medications up until recently. Allowed pt to take his own meds this past weekend and realized on Tuesday he had not been taking it and he decompensated.   No safety concerns with patient returning home, reports pt has an outpatient MM appt on next Wednesday at 3pm at [facility]. Mother will pick up at discharge.  The suicide prevention education provided includes the following: Suicide risk factors Suicide prevention and interventions National Suicide Hotline telephone number [facility] assessment telephone number [facility] Emergency Assistance 911 [facility] and/or Residential Mobile Crisis Unit telephone number  Request made of family/significant other to: Remove weapons (e.g., guns, rifles, knives), all items previously/currently identified as safety concern.   Remove drugs/medications (over-the-counter, prescriptions, illicit drugs), all items previously/currently identified as a safety concern.  The family member/significant other verbalizes understanding of the suicide prevention education information provided.  The family member/significant other agrees to remove the items of safety concern listed above.  Jenkins LULLA Primer 08/08/2024, 11:29 AM

## 2024-08-08 NOTE — Progress Notes (Signed)
   08/07/24 2141  Psych Admission Type (Psych Patients Only)  Admission Status Voluntary  Psychosocial Assessment  Patient Complaints Depression;Hopelessness;Self-harm thoughts  Eye Contact Fair  Facial Expression Flat  Affect Preoccupied  Speech Logical/coherent  Interaction Guarded;Minimal  Motor Activity Other (Comment) (WDL)  Appearance/Hygiene In scrubs  Behavior Characteristics Cooperative;Guarded  Mood Depressed  Thought Process  Coherency Blocking  Content Preoccupation;Paranoia  Delusions Paranoid  Perception Derealization  Hallucination None reported or observed  Judgment Impaired  Confusion Mild  Danger to Self  Current suicidal ideation? Denies  Agreement Not to Harm Self Yes  Description of Agreement Verbal  Danger to Others  Danger to Others None reported or observed

## 2024-08-08 NOTE — Plan of Care (Signed)
   Problem: Education: Goal: Knowledge of [facility] Education information/materials will improve Outcome: Progressing Goal: Emotional status will improve Outcome: Progressing Goal: Mental status will improve Outcome: Progressing Goal: Verbalization of understanding the information provided will improve Outcome: Progressing

## 2024-08-08 NOTE — Progress Notes (Signed)
(  Sleep Hours) - 7 (Any PRNs that were needed, meds refused, or side effects to meds)- No PRN meds given, no meds refused.  (Any disturbances and when (visitation, over night)- None  (Concerns raised by the patient)- None   (SI/HI/AVH)- Denies SI/HI/AVH.

## 2024-08-08 NOTE — Group Note (Signed)
 Date:  08/08/2024 Time:  4:25 AM  Group Topic/Focus:  Wrap-Up Group:   The focus of this group is to help patients review their daily goal of treatment and discuss progress on daily workbooks.    Participation Level:  Did Not Attend  Participation Quality:  N/A  Affect:  N/A  Cognitive:  N/A  Insight: None  Engagement in Group:  N/A  Modes of Intervention:  N/A  Additional Comments:  Patient was not admitted onto unit during the time of group.   Eward Mace 08/08/2024, 4:25 AM

## 2024-08-09 ENCOUNTER — Encounter (HOSPITAL_COMMUNITY): Payer: Self-pay

## 2024-08-09 DIAGNOSIS — F1290 Cannabis use, unspecified, uncomplicated: Secondary | ICD-10-CM | POA: Insufficient documentation

## 2024-08-09 LAB — VITAMIN D 25 HYDROXY (VIT D DEFICIENCY, FRACTURES): Vit D, 25-Hydroxy: 30.77 ng/mL (ref 30–100)

## 2024-08-09 LAB — POTASSIUM: Potassium: 3.6 mmol/L (ref 3.5–5.1)

## 2024-08-09 MED ORDER — RISPERIDONE 0.5 MG PO TABS
0.5000 mg | ORAL_TABLET | Freq: Once | ORAL | Status: AC
  Administered 2024-08-09: 0.5 mg via ORAL
  Filled 2024-08-09: qty 1

## 2024-08-09 MED ORDER — VITAMIN D 25 MCG (1000 UNIT) PO TABS
2000.0000 [IU] | ORAL_TABLET | Freq: Every day | ORAL | Status: DC
  Administered 2024-08-09 – 2024-08-12 (×4): 2000 [IU] via ORAL
  Filled 2024-08-09 (×2): qty 2
  Filled 2024-08-09: qty 14
  Filled 2024-08-09 (×3): qty 2

## 2024-08-09 MED ORDER — RISPERIDONE 1 MG PO TABS
1.0000 mg | ORAL_TABLET | Freq: Every day | ORAL | Status: DC
  Administered 2024-08-10 – 2024-08-11 (×2): 1 mg via ORAL
  Filled 2024-08-09: qty 7
  Filled 2024-08-09 (×2): qty 1

## 2024-08-09 NOTE — Group Note (Signed)
 Date:  08/09/2024 Time:  5:15 PM  Group Topic/Focus:  Dimensions of Wellness:   The focus of this group is to introduce the topic of wellness and discuss the role each dimension of wellness plays in total health.Patients discussed triggers that can cause setbacks in recovery and discussed deep breathing as a coping skill. Wellness Toolbox:   The focus of this group is to discuss various aspects of wellness, balancing those aspects and exploring ways to increase the ability to experience wellness.  Patients will create a wellness toolbox for use upon discharge.     Participation Level:  Active  Participation Quality:  Appropriate  Affect:  Appropriate  Cognitive:  Alert, Appropriate, and Oriented  Insight: Appropriate and Improving  Engagement in Group:  Engaged and Improving  Modes of Intervention:  Discussion, Education, Problem-solving, and Socialization  Additional Comments:  Joe Johnston attended and participated in this wellness group. He was respectful toward his peers.  [name] 08/09/2024, 5:15 PM

## 2024-08-09 NOTE — Group Note (Signed)
 Date:  08/09/2024 Time:  4:15 PM  Group Topic/Focus:  Exploring Lonliness    Participation Level:  Did Not Attend      Erlene Devita M Lashona Schaaf 08/09/2024, 4:15 PM

## 2024-08-09 NOTE — Progress Notes (Signed)
 [facility] MD Progress Note  08/09/2024 9:51 AM Brandom Kerwin  MRN:  [id number]  Reason for admission: This is the second psychiatric admission/treatments in this [facility] for this 18 year old AA male. Sergei was a patient in this [facility] from 07-20-24 to 07-23-24 for mood stabilization treatments. He was stabilized & discharged on medications with recommendations for a routine outpatient psychiatric follow-up care, medication management & counseling services. Shemar is being re-admitted to the [facility] this time around from [facility] with complaint of delusional thoughts/thinking such as asking his mother strange questions about (what was going on in his class. He asked if he had hurt someone. He apparently asked his mother if he had killed his father).These strange questions/bizarre behaviors led to this patient being taken to an urgent care to be checked out. While at the urgent care, patient complained of weakness & was taken to the [facility] for medical evaluation. After medical evaluation/clearance, patient was recommended for inpatient psychiatric evaluation/treatments.    Daily notes: Camryn is seen, chart reviewed. The chart findings discussed with the treatment team. He presents alert, oriented & aware of situation. He is visible on the unit today, attending group sessions. Mitcheal presents more alert & clearer minded today than yesterday during his admission evaluation. He reports, I'm doing well today, just a little tired. I slept well last night. I'm a little depressed today because my ankle hurts. I tore my meniscus in May of this year, had it repaired surgically, but from time to time, I still feel pain around it. Although, I feel better, I have been sleeping a lot. That is unlike me. My appetite is good. Shelley currently denies any SIHI, AVH, delusional thoughts or paranoia. He does not appear to be responding to any internal stimuli. Discussed this case with the attending psychiatrist, the  decision was made to combine the Risperdal  0.5 mg bid to Risperdal  1 mg po Q bedtime. This is to avoid the daytime sedation as patient is more than likely to go back to school after discharge. Patient is instructed & encouraged to ask his to give him something for pain. This provider did talk with patient's mother this afternoon. Mother reported that she visited her son last evening& felt he is not himself yet. She enquired about his medications which were explained to her. She was informed that Banner presents today much clear headed than during his admission evaluation yesterday. The changes made on his treatment plan explained to patient mother's. Patient's mother felt her questions were answered. Continue current plan of care as already in progress. Vital signs remain stable.  Principal Problem: Psychoactive substance-induced psychosis (HCC)  Diagnosis: Principal Problem:   Psychoactive substance-induced psychosis (HCC) Active Problems:   Cannabis use disorder R/o schizophrenia.    Total Time spent with patient: 45 minutes  Past Psychiatric History: Cannabis use disorder.  Past Medical History:  Past Medical History:  Diagnosis Date   Asthma    G6PD deficiency     Past Surgical History:  Procedure Laterality Date   MYRINGOTOMY WITH TUBE PLACEMENT     Family History: History reviewed. No pertinent family history.  Family Psychiatric  History: See H&P.  Social History:  Social History   Substance and Sexual Activity  Alcohol Use Not Currently   Comment: patient uses alcohol occasionally     Social History   Substance and Sexual Activity  Drug Use Yes   Frequency: 4.0 times per week   Types: Marijuana   Comment: Uses 3/4 times  per week    Social History   Socioeconomic History   Marital status: Single    Spouse name: Not on file   Number of children: Not on file   Years of education: Not on file   Highest education level: Not on file  Occupational History   Not  on file  Tobacco Use   Smoking status: Some Days    Types: Cigarettes   Smokeless tobacco: Not on file  Substance and Sexual Activity   Alcohol use: Not Currently    Comment: patient uses alcohol occasionally   Drug use: Yes    Frequency: 4.0 times per week    Types: Marijuana    Comment: Uses 3/4 times per week   Sexual activity: Yes  Other Topics Concern   Not on file  Social History Narrative   Not on file   Social Drivers of Health   Financial Resource Strain: Not on file  Food Insecurity: No Food Insecurity (08/07/2024)   Hunger Vital Sign    Worried About Running Out of Food in the Last Year: Never true    Ran Out of Food in the Last Year: Never true  Transportation Needs: No Transportation Needs (08/07/2024)   PRAPARE - Administrator, Civil Service (Medical): No    Lack of Transportation (Non-Medical): No  Physical Activity: Not on file  Stress: Not on file  Social Connections: Not on file   Additional Social History:    Sleep: Good Estimated Sleeping Duration (Last 24 Hours): 5.75-7.75 hours  Appetite:  Good  Current Medications: Current Facility-Administered Medications  Medication Dose Route Frequency Provider Last Rate Last Admin   acetaminophen  (TYLENOL ) tablet 650 mg  650 mg Oral Q6H PRN McCarty, Artie, MD       alum & mag hydroxide-simeth (MAALOX/MYLANTA) 200-200-20 MG/5ML suspension 30 mL  30 mL Oral Q4H PRN McCarty, Artie, MD       cholecalciferol  (VITAMIN D3) 25 MCG (1000 UNIT) tablet 2,000 Units  2,000 Units Oral Daily Kennyth Starleen RAMAN, MD       haloperidol  (HALDOL ) tablet 5 mg  5 mg Oral TID PRN McCarty, Artie, MD   5 mg at 08/08/24 1201   And   diphenhydrAMINE  (BENADRYL ) capsule 50 mg  50 mg Oral TID PRN McCarty, Artie, MD   50 mg at 08/08/24 1201   haloperidol  lactate (HALDOL ) injection 5 mg  5 mg Intramuscular TID PRN McCarty, Artie, MD       And   diphenhydrAMINE  (BENADRYL ) injection 50 mg  50 mg Intramuscular TID PRN McCarty,  Artie, MD       And   LORazepam  (ATIVAN ) injection 2 mg  2 mg Intramuscular TID PRN McCarty, Artie, MD       haloperidol  lactate (HALDOL ) injection 10 mg  10 mg Intramuscular TID PRN McCarty, Artie, MD       And   diphenhydrAMINE  (BENADRYL ) injection 50 mg  50 mg Intramuscular TID PRN McCarty, Artie, MD       And   LORazepam  (ATIVAN ) injection 2 mg  2 mg Intramuscular TID PRN McCarty, Artie, MD       hydrOXYzine  (ATARAX ) tablet 25 mg  25 mg Oral QHS McCarty, Artie, MD   25 mg at 08/08/24 2125   hydrOXYzine  (ATARAX ) tablet 25 mg  25 mg Oral TID PRN Prentis Kitchens A, DO   25 mg at 08/08/24 1905   magnesium  hydroxide (MILK OF MAGNESIA) suspension 30 mL  30 mL Oral Daily PRN  Cornelius Dines, MD       risperiDONE  (RISPERDAL ) tablet 0.5 mg  0.5 mg Oral BID Parker, Alvin S, MD   0.5 mg at 08/09/24 9144   Vitamin D  (Ergocalciferol ) (DRISDOL ) 1.25 MG (50000 UNIT) capsule 50,000 Units  50,000 Units Oral Q7 days Parker, Alvin S, MD   50,000 Units at 08/09/24 9144   Lab Results:  Results for orders placed or performed during the hospital encounter of 08/07/24 (from the past 48 hours)  Potassium     Status: None   Collection Time: 08/09/24  6:12 AM  Result Value Ref Range   Potassium 3.6 3.5 - 5.1 mmol/L    Comment: Performed at [facility], [address] [place], [place] [address]  VITAMIN D  25 Hydroxy (Vit-D Deficiency, Fractures)     Status: None   Collection Time: 08/09/24  6:12 AM  Result Value Ref Range   Vit D, 25-Hydroxy 30.77 30 - 100 ng/mL    Comment: (NOTE) Vitamin D  deficiency has been defined by the Institute of Medicine  and an Endocrine Society practice guideline as a level of serum 25-OH  vitamin D  less than 20 ng/mL (1,2). The Endocrine Society went on to  further define vitamin D  insufficiency as a level between 21 and 29  ng/mL (2).  1. IOM (Institute of Medicine). 2010. Dietary reference intakes for  calcium and D. [place]: [employer]. 2. Holick MF, Binkley C[place], Bischoff-Ferrari HA, et al. Evaluation,  treatment, and prevention of vitamin D  deficiency: an Endocrine  Society clinical practice guideline, JCEM. 2011 Jul; 96(7): 1911-30.  Performed at [facility], [address], [place], [place] [address]    Blood Alcohol level:  Lab Results  Component Value Date   [facility] <15 08/06/2024   ETH <15 07/20/2024    Metabolic Disorder Labs: Lab Results  Component Value Date   HGBA1C 4.6 (L) 07/20/2024   MPG 85 07/20/2024   No results found for: PROLACTIN Lab Results  Component Value Date   CHOL 133 07/22/2024   TRIG 36 07/22/2024   HDL 74 07/22/2024   CHOLHDL 1.8 07/22/2024   VLDL 7 07/22/2024   LDLCALC 52 07/22/2024   Physical Findings: AIMS:  ,  ,  ,  ,  ,  ,   CIWA:    COWS:     Musculoskeletal: Strength & Muscle Tone: within normal limits Gait & Station: normal Patient leans: N/A  Psychiatric Specialty Exam:  Presentation  General Appearance:  Casual; Fairly Groomed  Eye Contact: Fair  Speech: Clear and Coherent; Slow  Speech Volume: Decreased  Handedness: Right   Mood and Affect  Mood: Depressed  Affect: Congruent  Thought Process  Thought Processes: Coherent; Disorganized  Descriptions of Associations:Tangential  Orientation:Partial  Thought Content:Tangential  History of Schizophrenia/Schizoaffective disorder:No  Duration of Psychotic Symptoms:N/A  Hallucinations:Hallucinations: None Description of Auditory Hallucinations: NA  Ideas of Reference:None  Suicidal Thoughts:Suicidal Thoughts: No  Homicidal Thoughts:Homicidal Thoughts: No   Sensorium  Memory: Immediate Poor; Recent Fair; Remote Fair  Judgment: Impaired  Insight: Fair  Art therapist  Concentration: Good  Attention Span: Good  Recall: Good  Fund of Knowledge: Poor  Language: Fair  Psychomotor Activity  Psychomotor Activity: Psychomotor Activity:  Normal  Assets  Assets: Desire for Improvement; Financial Resources/Insurance; Housing; Resilience; Social Support  Sleep  Sleep: Sleep: Good Number of Hours of Sleep: 7  Physical Exam: Physical Exam Vitals and nursing note reviewed.  HENT:  Head: Normocephalic.     Nose: Nose normal.     Mouth/Throat:     Pharynx: Oropharynx is clear.  Cardiovascular:     Rate and Rhythm: Normal rate.     Pulses: Normal pulses.  Pulmonary:     Effort: Pulmonary effort is normal.  Genitourinary:    Comments: Deferred Musculoskeletal:        General: Normal range of motion.     Cervical back: Normal range of motion.  Skin:    General: Skin is dry.  Neurological:     Mental Status: He is alert.    Review of Systems  Constitutional:  Negative for chills, diaphoresis and fever.  HENT:  Negative for congestion and sore throat.   Respiratory:  Negative for cough, shortness of breath and wheezing.   Cardiovascular:  Negative for chest pain and palpitations.  Gastrointestinal:  Negative for abdominal pain, constipation, diarrhea, heartburn, nausea and vomiting.  Genitourinary:  Negative for dysuria.  Musculoskeletal:  Negative for joint pain and myalgias.  Neurological:  Negative for dizziness, tingling, tremors, sensory change, speech change, focal weakness, seizures, loss of consciousness, weakness and headaches.  Endo/Heme/Allergies:        Allergy:  Other: Fish.  Psychiatric/Behavioral:  Positive for substance abuse. The patient is nervous/anxious.    Blood pressure 109/71, pulse 77, temperature 98.1 F (36.7 C), temperature source Oral, resp. rate 18, height 5' 10 (1.778 m), weight 67.2 kg, SpO2 98%. Body mass index is 21.26 kg/m.  Treatment Plan Summary: Daily contact with patient to assess and evaluate symptoms and progress in treatment and Medication management.   Principal/active diagnoses.  Psychoactive substance-induced psychosis (HCC) Hx. Cannabis use disorder.  R/o  schizophrenia.  Plan: The risks/benefits/side-effects/alternatives to the medications in use were discussed in detail with the patient and time was given for patient's questions. The patient consents to medication trial.   -Continue Risperdal  1 mg po Q bedtime starting 08-10-24.  -Continue Risperdal  0.5 mg po tonight x 1 dose for mood control.  -Vitamin 50,000 units po Q 7 days for bone health.  -Continue Hydroxyzine  25 mg po tid prn for anxiety.   Agitation protocols.  -Continue as recommended (See MAR).  Other PRNS -Continue Tylenol  650 mg every 6 hours PRN for mild pain -Continue Maalox 30 ml Q 4 hrs PRN for indigestion -Continue MOM 30 ml po Q 6 hrs for constipation  Safety and Monitoring: Voluntary admission to inpatient psychiatric unit for safety, stabilization and treatment Daily contact with patient to assess and evaluate symptoms and progress in treatment Patient's case to be discussed in multi-disciplinary team meeting Observation Level : q15 minute checks Vital signs: q12 hours Precautions: Safety  Discharge Planning: Social work and case management to assist with discharge planning and identification of hospital follow-up needs prior to discharge Estimated LOS: 5-7 days Discharge Concerns: Need to establish a safety plan; Medication compliance and effectiveness Discharge Goals: Return home with outpatient referrals for mental health follow-up including medication management/psychotherapy  Mac Bolster, NP, pmhnp, fnp-bc. 08/09/2024, 9:51 AM

## 2024-08-09 NOTE — Plan of Care (Signed)
  Problem: Education: Goal: Knowledge of [place] [facility] Education information/materials will improve Outcome: Progressing Goal: Verbalization of understanding the information provided will improve Outcome: Progressing   Problem: Activity: Goal: Interest or engagement in activities will improve Outcome: Progressing Goal: Sleeping patterns will improve Outcome: Progressing

## 2024-08-09 NOTE — Group Note (Signed)
 Date:  08/09/2024 Time:  10:56 AM  Group Topic/Focus:  Goals Group:   The focus of this group is to help patients establish daily goals to achieve during treatment and discuss how the patient can incorporate goal setting into their daily lives to aide in recovery.    Participation Level:  Did Not Attend  Participation Quality:  N/A  Affect:  N/A  Cognitive:  N/A  Insight: None  Engagement in Group:  N/A  Modes of Intervention:  N/A  Additional Comments:  N/A  [name] Plaza 08/09/2024, 10:56 AM

## 2024-08-09 NOTE — BH IP Treatment Plan (Signed)
 Interdisciplinary Treatment and Diagnostic Plan Update  08/09/2024 Time of Session: 10:30 AM Joe Johnston MRN: [id number]  Principal Diagnosis: Psychoactive substance-induced psychosis (HCC)  Secondary Diagnoses: Principal Problem:   Psychoactive substance-induced psychosis (HCC) Active Problems:   Cannabis use disorder   Current Medications:  Current Facility-Administered Medications  Medication Dose Route Frequency Provider Last Rate Last Admin   acetaminophen  (TYLENOL ) tablet 650 mg  650 mg Oral Q6H PRN McCarty, Artie, MD       alum & mag hydroxide-simeth (MAALOX/MYLANTA) 200-200-20 MG/5ML suspension 30 mL  30 mL Oral Q4H PRN McCarty, Artie, MD       cholecalciferol  (VITAMIN D3) 25 MCG (1000 UNIT) tablet 2,000 Units  2,000 Units Oral Daily Kennyth Starleen RAMAN, MD       haloperidol  (HALDOL ) tablet 5 mg  5 mg Oral TID PRN McCarty, Artie, MD   5 mg at 08/08/24 1201   And   diphenhydrAMINE  (BENADRYL ) capsule 50 mg  50 mg Oral TID PRN McCarty, Artie, MD   50 mg at 08/08/24 1201   haloperidol  lactate (HALDOL ) injection 5 mg  5 mg Intramuscular TID PRN McCarty, Artie, MD       And   diphenhydrAMINE  (BENADRYL ) injection 50 mg  50 mg Intramuscular TID PRN McCarty, Artie, MD       And   LORazepam  (ATIVAN ) injection 2 mg  2 mg Intramuscular TID PRN McCarty, Artie, MD       haloperidol  lactate (HALDOL ) injection 10 mg  10 mg Intramuscular TID PRN McCarty, Artie, MD       And   diphenhydrAMINE  (BENADRYL ) injection 50 mg  50 mg Intramuscular TID PRN McCarty, Artie, MD       And   LORazepam  (ATIVAN ) injection 2 mg  2 mg Intramuscular TID PRN McCarty, Artie, MD       hydrOXYzine  (ATARAX ) tablet 25 mg  25 mg Oral QHS McCarty, Artie, MD   25 mg at 08/08/24 2125   hydrOXYzine  (ATARAX ) tablet 25 mg  25 mg Oral TID PRN Prentis Kitchens A, DO   25 mg at 08/08/24 1905   magnesium  hydroxide (MILK OF MAGNESIA) suspension 30 mL  30 mL Oral Daily PRN McCarty, Artie, MD       risperiDONE  (RISPERDAL ) tablet  0.5 mg  0.5 mg Oral BID Parker, Alvin S, MD   0.5 mg at 08/09/24 9144   Vitamin D  (Ergocalciferol ) (DRISDOL ) 1.25 MG (50000 UNIT) capsule 50,000 Units  50,000 Units Oral Q7 days Parker, Alvin S, MD   50,000 Units at 08/09/24 9144   PTA Medications: Medications Prior to Admission  Medication Sig Dispense Refill Last Dose/Taking   hydrOXYzine  (ATARAX ) 25 MG tablet Take 1 tablet (25 mg total) by mouth at bedtime. 30 tablet 0    hydrOXYzine  (ATARAX ) 25 MG tablet Take 1 tablet (25 mg total) by mouth 2 (two) times daily as needed for anxiety. 30 tablet 0    risperiDONE  (RISPERDAL ) 0.5 MG tablet Take 1 tablet (0.5 mg total) by mouth at bedtime. 30 tablet 0    traZODone  (DESYREL ) 50 MG tablet Take 1 tablet (50 mg total) by mouth at bedtime. (Patient not taking: Reported on 08/07/2024) 30 tablet 0    Vitamin D , Ergocalciferol , (DRISDOL ) 1.25 MG (50000 UNIT) CAPS capsule Take 1 capsule (50,000 Units total) by mouth every 7 (seven) days. 4 capsule 0     Patient Stressors:    Patient Strengths:    Treatment Modalities: Medication Management, Group therapy, Case management,  1 to 1  session with clinician, Psychoeducation, Recreational therapy.   Physician Treatment Plan for Primary Diagnosis: Psychoactive substance-induced psychosis (HCC) Long Term Goal(s): Improvement in symptoms so as ready for discharge   Short Term Goals: Ability to identify and develop effective coping behaviors will improve Ability to maintain clinical measurements within normal limits will improve Compliance with prescribed medications will improve Ability to identify triggers associated with substance abuse/mental health issues will improve Ability to identify changes in lifestyle to reduce recurrence of condition will improve Ability to verbalize feelings will improve Ability to disclose and discuss suicidal ideas Ability to demonstrate self-control will improve  Medication Management: Evaluate patient's response, side  effects, and tolerance of medication regimen.  Therapeutic Interventions: 1 to 1 sessions, Unit Group sessions and Medication administration.  Evaluation of Outcomes: Not Progressing  Physician Treatment Plan for Secondary Diagnosis: Principal Problem:   Psychoactive substance-induced psychosis (HCC) Active Problems:   Cannabis use disorder  Long Term Goal(s): Improvement in symptoms so as ready for discharge   Short Term Goals: Ability to identify and develop effective coping behaviors will improve Ability to maintain clinical measurements within normal limits will improve Compliance with prescribed medications will improve Ability to identify triggers associated with substance abuse/mental health issues will improve Ability to identify changes in lifestyle to reduce recurrence of condition will improve Ability to verbalize feelings will improve Ability to disclose and discuss suicidal ideas Ability to demonstrate self-control will improve     Medication Management: Evaluate patient's response, side effects, and tolerance of medication regimen.  Therapeutic Interventions: 1 to 1 sessions, Unit Group sessions and Medication administration.  Evaluation of Outcomes: Not Progressing   RN Treatment Plan for Primary Diagnosis: Psychoactive substance-induced psychosis (HCC) Long Term Goal(s): Knowledge of disease and therapeutic regimen to maintain health will improve  Short Term Goals: Ability to remain free from injury will improve, Ability to verbalize frustration and anger appropriately will improve, Ability to demonstrate self-control, Ability to participate in decision making will improve, Ability to verbalize feelings will improve, Ability to disclose and discuss suicidal ideas, Ability to identify and develop effective coping behaviors will improve, and Compliance with prescribed medications will improve  Medication Management: RN will administer medications as ordered by  provider, will assess and evaluate patient's response and provide education to patient for prescribed medication. RN will report any adverse and/or side effects to prescribing provider.  Therapeutic Interventions: 1 on 1 counseling sessions, Psychoeducation, Medication administration, Evaluate responses to treatment, Monitor vital signs and CBGs as ordered, Perform/monitor CIWA, COWS, AIMS and Fall Risk screenings as ordered, Perform wound care treatments as ordered.  Evaluation of Outcomes: Not Progressing   LCSW Treatment Plan for Primary Diagnosis: Psychoactive substance-induced psychosis (HCC) Long Term Goal(s): Safe transition to appropriate next level of care at discharge, Engage patient in therapeutic group addressing interpersonal concerns.  Short Term Goals: Engage patient in aftercare planning with referrals and resources, Increase social support, Increase ability to appropriately verbalize feelings, Increase emotional regulation, Facilitate acceptance of mental health diagnosis and concerns, Facilitate patient progression through stages of change regarding substance use diagnoses and concerns, Identify triggers associated with mental health/substance abuse issues, and Increase skills for wellness and recovery  Therapeutic Interventions: Assess for all discharge needs, 1 to 1 time with Social worker, Explore available resources and support systems, Assess for adequacy in community support network, Educate family and significant other(s) on suicide prevention, Complete Psychosocial Assessment, Interpersonal group therapy.  Evaluation of Outcomes: Not Progressing   Progress in Treatment: Attending groups:  Yes. Participating in groups: Yes. Taking medication as prescribed: Yes. Toleration medication: Yes. Family/Significant other contact made: Yes, individual(s) contacted:  Mother, Hardin Poche [phone]. Patient understands diagnosis: Yes. Discussing patient identified  problems/goals with staff: Yes. Medical problems stabilized or resolved: Yes. Denies suicidal/homicidal ideation: No. Issues/concerns per patient self-inventory: No.  New problem(s) identified: No, Describe:  none reported.  New Short Term/Long Term Goal(s): detox, medication management for mood stabilization; elimination of SI thoughts; development of comprehensive mental wellness/sobriety plan.    Patient Goals: I want to leave drugs alone.  Discharge Plan or Barriers: Patient recently admitted. CSW will continue to follow and assess for appropriate referrals and possible discharge planning.    Reason for Continuation of Hospitalization: Anxiety Depression Medication stabilization  Estimated Length of Stay: 5-7 days  Last 3 [place] Suicide Severity Risk Score: Flowsheet Row [facility] (Current[facility] Most recent reading at 08/07/2024  9:41 PM ED from 08/06/2024 in [facility] Most recent reading at 08/06/2024 11:23 PM ED from 08/06/2024 in [facility] at [facility] Most recent reading at 08/06/2024  6:06 PM  C-SSRS RISK CATEGORY Low Risk Error: Q3, 4, or 5 should not be populated when Q2 is No Low Risk    Last PHQ 2/9 Scores:     No data to display          Scribe for Treatment Team: Louetta Wynona SILK 08/09/2024 11:37 AM

## 2024-08-09 NOTE — Plan of Care (Signed)
   Problem: Education: Goal: Knowledge of [facility] Education information/materials will improve Outcome: Progressing Goal: Emotional status will improve Outcome: Progressing Goal: Mental status will improve Outcome: Progressing Goal: Verbalization of understanding the information provided will improve Outcome: Progressing

## 2024-08-09 NOTE — BH Assessment (Signed)
(  Sleep Hours) - 8 (Any PRNs that were needed, meds refused, or side effects to meds)- Hydroxyzine  25 mg/Sleep (Any disturbances and when (visitation, over night)- None (Concerns raised by the patient)- Pt req to be D/C home. (SI/HI/AVH)- Denies

## 2024-08-09 NOTE — Progress Notes (Signed)
   08/09/24 0900  Psych Admission Type (Psych Patients Only)  Admission Status Voluntary  Psychosocial Assessment  Patient Complaints Depression  Eye Contact Fair  Facial Expression Flat  Affect Appropriate to circumstance  Speech Logical/coherent  Interaction Guarded  Motor Activity Slow  Appearance/Hygiene In scrubs  Behavior Characteristics Guarded  Mood Apprehensive  Thought Process  Coherency Blocking  Content Paranoia  Delusions Paranoid  Perception Derealization  Hallucination None reported or observed  Judgment Poor  Confusion Mild  Danger to Self  Current suicidal ideation? Denies  Agreement Not to Harm Self Yes  Description of Agreement verbal  Danger to Others  Danger to Others None reported or observed

## 2024-08-09 NOTE — Group Note (Signed)
 Date:  08/09/2024 Time:  9:40 AM  Group Topic/Focus: Positivity  The goal of this group was for each person to write their name in the middle of a piece of paper and pass it around the dayroom to their peers. Each person was to write a positive comment anonymously about each other. Each patient was allowed to share what was said and reflect on their goals for today.   Participation Level:  Did Not Attend  Participation Quality:  N/A  Affect:  N/A  Cognitive:  N/A  Insight: None  Engagement in Group:  N/A  Modes of Intervention:  N/A  Additional Comments:  N/A  [name] Plaza 08/09/2024, 9:40 AM

## 2024-08-09 NOTE — Progress Notes (Signed)
   08/09/24 2025  Psych Admission Type (Psych Patients Only)  Admission Status Voluntary  Psychosocial Assessment  Patient Complaints None  Eye Contact Fair  Facial Expression Flat  Affect Appropriate to circumstance  Speech Logical/coherent  Interaction Assertive  Motor Activity Other (Comment) (WNL)  Appearance/Hygiene Unremarkable  Behavior Characteristics Appropriate to situation  Mood Pleasant  Thought Process  Coherency WDL  Content WDL  Delusions None reported or observed  Perception WDL  Hallucination None reported or observed  Judgment Limited  Confusion Mild  Danger to Self  Current suicidal ideation?  (Denies)  Agreement Not to Harm Self Yes  Description of Agreement Notify Staff  Danger to Others  Danger to Others None reported or observed

## 2024-08-09 NOTE — [facility] Group Notes (Deleted)
 Psychoeducational Group Note  Date:  08/09/2024 Time:  2000  Group Topic/Focus:  Alcoholics Anonymous Meeting  Participation Level: Did Not Attend  Participation Quality:  Not Applicable  Affect:  Not Applicable  Cognitive:  Not Applicable  Insight:  Not Applicable  Engagement in Group: Not Applicable  Additional Comments:  Did not attend.   Lenora Shaver S 08/09/2024, 10:06 PM

## 2024-08-09 NOTE — Progress Notes (Signed)
 Pt currently in visitation with dad; he is crying and crying into dad's shoulder

## 2024-08-10 NOTE — Progress Notes (Signed)
 [facility] MD Progress Note  08/10/2024 2:54 PM Joe Johnston  MRN:  [id number]  Reason for admission: This is the second psychiatric admission/treatments in this [facility] for this 18 year old AA male. Joe Johnston was a patient in this [facility] from 07-20-24 to 07-23-24 for mood stabilization treatments. He was stabilized & discharged on medications with recommendations for a routine outpatient psychiatric follow-up care, medication management & counseling services. Joe Johnston is being re-admitted to the [facility] this time around from [facility] with complaint of delusional thoughts/thinking such as asking his mother strange questions about (what was going on in his class. He asked if he had hurt someone. He apparently asked his mother if he had killed his father).These strange questions/bizarre behaviors led to this patient being taken to an urgent care to be checked out. While at the urgent care, patient complained of weakness & was taken to the [facility] for medical evaluation. After medical evaluation/clearance, patient was recommended for inpatient psychiatric evaluation/treatments.    Daily notes: Joe Johnston is seen this morning, chart reviewed. The chart findings discussed with the treatment team. He presents alert, oriented & aware of situation. He is visible on the unit today, attending group sessions. Patient continues to show improved mood, clearer minded & able to recollect what was triggering his symptoms (cannabis). He reports, Today is a good day. My mood is good. I feel energized. I also feel like something has been lifted up in my mind. The first time I was in this hospital, I wasn't really thinking about what I was doing that were causing problems for me. When my mom tells me to do something or help her get something done, I will feel like she was stressing me out. Then I will join my friends in weed smoking to not deal with what my emotion was feeling at the time. The thing I learned from being here is, if it  has not been the weed smoking, I would not have stepped foot inside this hospital. I'm doing much better. I slept well last night. My dad visited me last evening. It was a good visit. Patient currently denies any SIHI, AVH, delusional thoughts or paranoia. He does not appear to be responding to any internal stimuli. Patient will start Risperdal  1 mg tonight. He was previously receiving Risperdal  0.5 mg bid & because of complaint of feeling drowsy in the morning after taking Risperdal  0.5 mg. And to avoid daytime sedation, his Risperdal  0.5 mg bid has been combined to Risperdal  1 mg at bedtime which will start tonight. If patient tolerates this tonight & tomorrow night, will be good for a Monday 08-25--25 discharge. Says will need a note from the SW to go back to school. Vital signs remain stable. Continue current plan of care as already in progress.  Principal Problem: Psychoactive substance-induced psychosis (HCC)  Diagnosis: Principal Problem:   Psychoactive substance-induced psychosis (HCC) Active Problems:   Cannabis use disorder R/o schizophrenia.    Total Time spent with patient: 45 minutes  Past Psychiatric History: Cannabis use disorder.  Past Medical History:  Past Medical History:  Diagnosis Date   Asthma    G6PD deficiency     Past Surgical History:  Procedure Laterality Date   MYRINGOTOMY WITH TUBE PLACEMENT     Family History: History reviewed. No pertinent family history.  Family Psychiatric  History: See H&P.  Social History:  Social History   Substance and Sexual Activity  Alcohol Use Not Currently   Comment: patient uses alcohol occasionally  Social History   Substance and Sexual Activity  Drug Use Yes   Frequency: 4.0 times per week   Types: Marijuana   Comment: Uses 3/4 times per week    Social History   Socioeconomic History   Marital status: Single    Spouse name: Not on file   Number of children: Not on file   Years of education: Not on file    Highest education level: Not on file  Occupational History   Not on file  Tobacco Use   Smoking status: Some Days    Types: Cigarettes   Smokeless tobacco: Not on file  Substance and Sexual Activity   Alcohol use: Not Currently    Comment: patient uses alcohol occasionally   Drug use: Yes    Frequency: 4.0 times per week    Types: Marijuana    Comment: Uses 3/4 times per week   Sexual activity: Yes  Other Topics Concern   Not on file  Social History Narrative   Not on file   Social Drivers of Health   Financial Resource Strain: Not on file  Food Insecurity: No Food Insecurity (08/07/2024)   Hunger Vital Sign    Worried About Running Out of Food in the Last Year: Never true    Ran Out of Food in the Last Year: Never true  Transportation Needs: No Transportation Needs (08/07/2024)   PRAPARE - Administrator, Civil Service (Medical): No    Lack of Transportation (Non-Medical): No  Physical Activity: Not on file  Stress: Not on file  Social Connections: Not on file   Additional Social History:    Sleep: Good Estimated Sleeping Duration (Last 24 Hours): 5.50-6.50 hours  Appetite:  Good  Current Medications: Current Facility-Administered Medications  Medication Dose Route Frequency Provider Last Rate Last Admin   acetaminophen  (TYLENOL ) tablet 650 mg  650 mg Oral Q6H PRN McCarty, Artie, MD       alum & mag hydroxide-simeth (MAALOX/MYLANTA) 200-200-20 MG/5ML suspension 30 mL  30 mL Oral Q4H PRN McCarty, Artie, MD       cholecalciferol  (VITAMIN D3) 25 MCG (1000 UNIT) tablet 2,000 Units  2,000 Units Oral Daily Parker, Alvin S, MD   2,000 Units at 08/10/24 0755   haloperidol  (HALDOL ) tablet 5 mg  5 mg Oral TID PRN McCarty, Artie, MD   5 mg at 08/08/24 1201   And   diphenhydrAMINE  (BENADRYL ) capsule 50 mg  50 mg Oral TID PRN McCarty, Artie, MD   50 mg at 08/08/24 1201   haloperidol  lactate (HALDOL ) injection 5 mg  5 mg Intramuscular TID PRN McCarty, Artie, MD        And   diphenhydrAMINE  (BENADRYL ) injection 50 mg  50 mg Intramuscular TID PRN McCarty, Artie, MD       And   LORazepam  (ATIVAN ) injection 2 mg  2 mg Intramuscular TID PRN McCarty, Artie, MD       haloperidol  lactate (HALDOL ) injection 10 mg  10 mg Intramuscular TID PRN McCarty, Artie, MD       And   diphenhydrAMINE  (BENADRYL ) injection 50 mg  50 mg Intramuscular TID PRN McCarty, Artie, MD       And   LORazepam  (ATIVAN ) injection 2 mg  2 mg Intramuscular TID PRN McCarty, Artie, MD       hydrOXYzine  (ATARAX ) tablet 25 mg  25 mg Oral QHS McCarty, Artie, MD   25 mg at 08/09/24 2121   hydrOXYzine  (ATARAX ) tablet 25 mg  25 mg Oral TID PRN Prentis Oliva LABOR, DO   25 mg at 08/09/24 2336   magnesium  hydroxide (MILK OF MAGNESIA) suspension 30 mL  30 mL Oral Daily PRN McCarty, Artie, MD       risperiDONE  (RISPERDAL ) tablet 1 mg  1 mg Oral QHS Yerik Zeringue I, NP       Vitamin D  (Ergocalciferol ) (DRISDOL ) 1.25 MG (50000 UNIT) capsule 50,000 Units  50,000 Units Oral Q7 days Parker, Alvin S, MD   50,000 Units at 08/09/24 9144   Lab Results:  Results for orders placed or performed during the hospital encounter of 08/07/24 (from the past 48 hours)  Potassium     Status: None   Collection Time: 08/09/24  6:12 AM  Result Value Ref Range   Potassium 3.6 3.5 - 5.1 mmol/L    Comment: Performed at [facility], [address], [place], [place] [address]  VITAMIN D  25 Hydroxy (Vit-D Deficiency, Fractures)     Status: None   Collection Time: 08/09/24  6:12 AM  Result Value Ref Range   Vit D, 25-Hydroxy 30.77 30 - 100 ng/mL    Comment: (NOTE) Vitamin D  deficiency has been defined by the Institute of Medicine  and an Endocrine Society practice guideline as a level of serum 25-OH  vitamin D  less than 20 ng/mL (1,2). The Endocrine Society went on to  further define vitamin D  insufficiency as a level between 21 and 29  ng/mL (2).  1. IOM (Institute of Medicine). 2010. Dietary reference  intakes for  calcium and D. [place]: [employer]. 2. Holick MF, Binkley [place], Bischoff-Ferrari HA, et al. Evaluation,  treatment, and prevention of vitamin D  deficiency: an Endocrine  Society clinical practice guideline, JCEM. 2011 Jul; 96(7): 1911-30.  Performed at [facility], [address] [place], [place] [address]    Blood Alcohol level:  Lab Results  Component Value Date   [facility] <15 08/06/2024   ETH <15 07/20/2024    Metabolic Disorder Labs: Lab Results  Component Value Date   HGBA1C 4.6 (L) 07/20/2024   MPG 85 07/20/2024   No results found for: PROLACTIN Lab Results  Component Value Date   CHOL 133 07/22/2024   TRIG 36 07/22/2024   HDL 74 07/22/2024   CHOLHDL 1.8 07/22/2024   VLDL 7 07/22/2024   LDLCALC 52 07/22/2024   Physical Findings: AIMS:  ,  ,  ,  ,  ,  ,   CIWA:    COWS:     Musculoskeletal: Strength & Muscle Tone: within normal limits Gait & Station: normal Patient leans: N/A  Psychiatric Specialty Exam:  Presentation  General Appearance:  Appropriate for Environment; Casual; Fairly Groomed  Eye Contact: Good  Speech: Clear and Coherent; Normal Rate  Speech Volume: Normal  Handedness: Right   Mood and Affect  Mood: -- (Improving.)  Affect: Congruent; Appropriate  Thought Process  Thought Processes: Coherent; Goal Directed; Linear  Descriptions of Associations:Intact  Orientation:Full (Time, Place and Person)  Thought Content:Logical  History of Schizophrenia/Schizoaffective disorder:No  Duration of Psychotic Symptoms:N/A  Hallucinations:Hallucinations: None Description of Auditory Hallucinations: NA   Ideas of Reference:None  Suicidal Thoughts:Suicidal Thoughts: No   Homicidal Thoughts:Homicidal Thoughts: No    Sensorium  Memory: Immediate Good; Recent Good; Remote Good  Judgment: Intact  Insight: -- (Improving.)  Executive Functions   Concentration: Good  Attention Span: Good  Recall: Good  Fund of Knowledge: Fair  Language: Good  Psychomotor Activity  Psychomotor Activity: Psychomotor Activity: Normal   Assets  Assets: Communication Skills; Desire for Improvement; Financial Resources/Insurance; Housing; Physical Health; Resilience; Social Support  Sleep  Sleep: Sleep: Good Number of Hours of Sleep: 6.5   Physical Exam: Physical Exam Vitals and nursing note reviewed.  HENT:     Head: Normocephalic.     Nose: Nose normal.     Mouth/Throat:     Pharynx: Oropharynx is clear.  Cardiovascular:     Rate and Rhythm: Normal rate.     Pulses: Normal pulses.  Pulmonary:     Effort: Pulmonary effort is normal.  Genitourinary:    Comments: Deferred Musculoskeletal:        General: Normal range of motion.     Cervical back: Normal range of motion.  Skin:    General: Skin is dry.  Neurological:     Mental Status: He is alert.    Review of Systems  Constitutional:  Negative for chills, diaphoresis and fever.  HENT:  Negative for congestion and sore throat.   Respiratory:  Negative for cough, shortness of breath and wheezing.   Cardiovascular:  Negative for chest pain and palpitations.  Gastrointestinal:  Negative for abdominal pain, constipation, diarrhea, heartburn, nausea and vomiting.  Genitourinary:  Negative for dysuria.  Musculoskeletal:  Negative for joint pain and myalgias.  Neurological:  Negative for dizziness, tingling, tremors, sensory change, speech change, focal weakness, seizures, loss of consciousness, weakness and headaches.  Endo/Heme/Allergies:        Allergy:  Other: Fish.  Psychiatric/Behavioral:  Positive for substance abuse. The patient is nervous/anxious.    Blood pressure 118/75, pulse 84, temperature 98.1 F (36.7 C), temperature source Oral, resp. rate 18, height 5' 10 (1.778 m), weight 67.2 kg, SpO2 100%. Body mass index is 21.26 kg/m.  Treatment Plan  Summary: Daily contact with patient to assess and evaluate symptoms and progress in treatment and Medication management.   Principal/active diagnoses.  Psychoactive substance-induced psychosis (HCC) Hx. Cannabis use disorder.  R/o schizophrenia.  Plan: The risks/benefits/side-effects/alternatives to the medications in use were discussed in detail with the patient and time was given for patient's questions. The patient consents to medication trial.   -Continue Risperdal  1 mg po Q bedtime starting 08-10-24.  -Completed Risperdal  0.5 mg po tonight x 1 dose for mood control.  -Vitamin 50,000 units po Q 7 days for bone health.  -Continue Hydroxyzine  25 mg po tid prn for anxiety.   Agitation protocols.  -Continue as recommended (See MAR).  Other PRNS -Continue Tylenol  650 mg every 6 hours PRN for mild pain -Continue Maalox 30 ml Q 4 hrs PRN for indigestion -Continue MOM 30 ml po Q 6 hrs for constipation  Safety and Monitoring: Voluntary admission to inpatient psychiatric unit for safety, stabilization and treatment Daily contact with patient to assess and evaluate symptoms and progress in treatment Patient's case to be discussed in multi-disciplinary team meeting Observation Level : q15 minute checks Vital signs: q12 hours Precautions: Safety  Discharge Planning: Social work and case management to assist with discharge planning and identification of hospital follow-up needs prior to discharge Estimated LOS: 5-7 days Discharge Concerns: Need to establish a safety plan; Medication compliance and effectiveness Discharge Goals: Return home with outpatient referrals for mental health follow-up including medication management/psychotherapy  Mac Bolster, NP, pmhnp, fnp-bc. 08/10/2024, 2:54 PM Patient ID: Morene Paradise, male   DOB: [birth year], 18 y.o.   MRN: [id number]

## 2024-08-10 NOTE — Plan of Care (Signed)
   Problem: Education: Goal: Knowledge of [facility] Education information/materials will improve Outcome: Progressing Goal: Emotional status will improve Outcome: Progressing Goal: Mental status will improve Outcome: Progressing Goal: Verbalization of understanding the information provided will improve Outcome: Progressing

## 2024-08-10 NOTE — Group Note (Unsigned)
 Date:  08/11/2024 Time:  12:14 AM  Group Topic/Focus:  Wrap-Up Group:   The focus of this group is to help patients review their daily goal of treatment and discuss progress on daily workbooks.    Participation Level:  Active  Participation Quality:  Attentive  Affect:  Appropriate  Cognitive:  Appropriate  Insight: Appropriate and Good  Engagement in Group:  Engaged  Modes of Intervention:  Discussion  Additional Comments:  Patient stated that he had a great day.  Patient stated he had a 8 out of 10 day.  Patient stated his goal for tomorrow is to take one day at a time .  Bari Moats 08/11/2024, 12:14 AM

## 2024-08-10 NOTE — BH Assessment (Signed)
(  Sleep Hours) - 6.5 (Any PRNs that were needed, meds refused, or side effects to meds)- None (Any disturbances and when (visitation, over night)- None (Concerns raised by the patient)- None (SI/HI/AVH)- Confirmed feeling SI/Contracted to safety. Denies HI/A/V/H

## 2024-08-10 NOTE — [facility] Group Notes (Signed)
 [facility] Group Notes:  (Nursing/MHT/Case Management/Adjunct)  Date:  08/09/2024 Time:  2000  Type of Therapy:  Alcoholics Anonymous Meeting  Participation Level:  Active  Participation Quality:  Appropriate, Attentive, Sharing, and Supportive  Affect:  Appropriate  Cognitive:  Alert  Insight:  Improving  Engagement in Group:  Engaged  Modes of Intervention:  Clarification, Education, and Support  Summary of Progress/Problems:  Joe Johnston 08/10/2024, 4:34 AM

## 2024-08-10 NOTE — Progress Notes (Signed)
   08/10/24 1300  Psych Admission Type (Psych Patients Only)  Admission Status Voluntary  Psychosocial Assessment  Patient Complaints None  Eye Contact Fair  Facial Expression Flat  Affect Appropriate to circumstance  Speech Logical/coherent  Interaction Assertive  Motor Activity Slow  Appearance/Hygiene Unremarkable  Behavior Characteristics Appropriate to situation  Mood Pleasant  Thought Process  Coherency WDL  Content WDL  Delusions None reported or observed  Perception WDL  Hallucination None reported or observed  Judgment Limited  Confusion Mild  Danger to Self  Current suicidal ideation? Passive  Description of Suicide Plan None  Agreement Not to Harm Self Yes  Description of Agreement Notify Staff  Danger to Others  Danger to Others None reported or observed

## 2024-08-10 NOTE — Group Note (Signed)
 Date:  08/10/2024 Time:  10:19 AM  Group Topic/Focus:  Goals Group:   The focus of this group is to help patients establish daily goals to achieve during treatment and discuss how the patient can incorporate goal setting into their daily lives to aide in recovery.    Participation Level:  Active  Participation Quality:  Appropriate  Affect:  Appropriate  Cognitive:  Appropriate  Insight: Appropriate  Engagement in Group:  Engaged  Modes of Intervention:  Activity  Additional Comments:  Pt plans to open up more with his peers  Nat Rummer 08/10/2024, 10:19 AM

## 2024-08-10 NOTE — Group Note (Signed)
 LCSW Group Therapy Note  @TD @   10:00am-11:00am  Type of Therapy and Topic:  Group Therapy: Gratitude  Participation Level:  Active   Description of Group:   In this group, patients shared and discussed the importance of acknowledging the elements in their lives for which they are grateful and how this can positively impact their mood.  The group discussed how bringing the positive elements of their lives to the forefront of their minds can help with recovery from any illness, physical or mental.  An exercise was done as a group in which a list was made of gratitude items in order to encourage participants to consider other potential positives in their lives.  Therapeutic Goals: Patients will discuss quotes about gratitude and explore how a change of attitude can make life more joyful. Patients will identify one or more items for which they are grateful in each of 6 categories:  people, experiences, things, places, skills, and other. Patients will discuss how it is possible to seek out gratitude in even bad situations. Patients will explore how the lack of gratitude can bring them down.   Summary of Patient Progress:  The patient shared that he is grateful for girlfriend.  Patient's reaction to the group was engaged and responding to others.  Therapeutic Modalities:   Solution-Focused Therapy Activity  Eudell Julian O Borghild Thaker, LCSWA 08/10/2024  1:24 PM

## 2024-08-10 NOTE — Plan of Care (Signed)
   Problem: Education: Goal: Emotional status will improve Outcome: Progressing Goal: Mental status will improve Outcome: Progressing   Problem: Activity: Goal: Interest or engagement in activities will improve Outcome: Progressing

## 2024-08-10 NOTE — Group Note (Signed)
 Date:  08/10/2024 Time:  5:15 PM  Group used a non- competitive card game to build mindful listening  skills and encourage acceptance and understanding of oneself and peers. Patients cooperate and share, fostering empathy and personal growth in a supportive environment    Participation Level:  Active  Participation Quality:  Attentive, Sharing, and Supportive  Affect:  Appropriate  Cognitive:  Alert and Appropriate  Insight: Appropriate and Good  Engagement in Group:  Engaged and Supportive  Modes of Intervention:  Activity, Discussion, Exploration, Rapport Building, Socialization, and Support  Additional Comments:    Joe Johnston 08/10/2024, 5:15 PM

## 2024-08-11 DIAGNOSIS — F209 Schizophrenia, unspecified: Secondary | ICD-10-CM

## 2024-08-11 NOTE — Progress Notes (Signed)
 [facility] MD Progress Note  08/11/2024 3:13 PM Joe Johnston  MRN:  [id number]  Reason for admission: This is the second psychiatric admission/treatments in this [facility] for this 18 year old AA male. Joe Johnston was a patient in this [facility] from 07-20-24 to 07-23-24 for mood stabilization treatments. He was stabilized & discharged on medications with recommendations for a routine outpatient psychiatric follow-up care, medication management & counseling services. Joe Johnston is being re-admitted to the [facility] this time around from [facility] with complaint of delusional thoughts/thinking such as asking his mother strange questions about (what was going on in his class. He asked if he had hurt someone. He apparently asked his mother if he had killed his father).These strange questions/bizarre behaviors led to this patient being taken to an urgent care to be checked out. While at the urgent care, patient complained of weakness & was taken to the [facility] for medical evaluation. After medical evaluation/clearance, patient was recommended for inpatient psychiatric evaluation/treatments.    Daily notes: Joe Johnston is seen this morning, chart reviewed. The chart findings discussed with the treatment team. He presents alert, oriented & aware of situation. He is visible on the unit, attending group sessions. He is making a good eye contact & verbally responsive. His affect is good. He reports, I'm doing great. My mood is good. I feel refreshed after a good night sleep. Since being here this second time, I have been thinking about my future. What I see you guys do impressed me. May be, I should change my course of study to psychology. I feel like I'm much better now than when I first got to this hospital. I couldn't get myself to think clearly then, but my whole well being is back. I'm wondering why among all of my friends who were & still smoking weed, I'm the one that got admitted to the hospital twice. Patient is informed that  this tells him that his mind, brain & whole body cannot handle cannabis or other illegal drugs. Joe Johnston currently denies any SIHI, AVH, delusional thoughts or paranoia. He does not appear to be responding to any internal stimuli. Patient can be discharged tomorrow 08-12-24.  Principal Problem: Psychoactive substance-induced psychosis (HCC)  Diagnosis: Principal Problem:   Psychoactive substance-induced psychosis (HCC) Active Problems:   Cannabis use disorder R/o schizophrenia.    Total Time spent with patient: 45 minutes  Past Psychiatric History: Cannabis use disorder.  Past Medical History:  Past Medical History:  Diagnosis Date   Asthma    G6PD deficiency     Past Surgical History:  Procedure Laterality Date   MYRINGOTOMY WITH TUBE PLACEMENT     Family History: History reviewed. No pertinent family history.  Family Psychiatric  History: See H&P.  Social History:  Social History   Substance and Sexual Activity  Alcohol Use Not Currently   Comment: patient uses alcohol occasionally     Social History   Substance and Sexual Activity  Drug Use Yes   Frequency: 4.0 times per week   Types: Marijuana   Comment: Uses 3/4 times per week    Social History   Socioeconomic History   Marital status: Single    Spouse name: Not on file   Number of children: Not on file   Years of education: Not on file   Highest education level: Not on file  Occupational History   Not on file  Tobacco Use   Smoking status: Some Days    Types: Cigarettes   Smokeless tobacco: Not  on file  Substance and Sexual Activity   Alcohol use: Not Currently    Comment: patient uses alcohol occasionally   Drug use: Yes    Frequency: 4.0 times per week    Types: Marijuana    Comment: Uses 3/4 times per week   Sexual activity: Yes  Other Topics Concern   Not on file  Social History Narrative   Not on file   Social Drivers of Health   Financial Resource Strain: Not on file  Food  Insecurity: No Food Insecurity (08/07/2024)   Hunger Vital Sign    Worried About Running Out of Food in the Last Year: Never true    Ran Out of Food in the Last Year: Never true  Transportation Needs: No Transportation Needs (08/07/2024)   PRAPARE - Administrator, Civil Service (Medical): No    Lack of Transportation (Non-Medical): No  Physical Activity: Not on file  Stress: Not on file  Social Connections: Not on file   Additional Social History:    Sleep: Good Estimated Sleeping Duration (Last 24 Hours): 3.75-4.75 hours  Appetite:  Good  Current Medications: Current Facility-Administered Medications  Medication Dose Route Frequency Provider Last Rate Last Admin   acetaminophen  (TYLENOL ) tablet 650 mg  650 mg Oral Q6H PRN McCarty, Artie, MD       alum & mag hydroxide-simeth (MAALOX/MYLANTA) 200-200-20 MG/5ML suspension 30 mL  30 mL Oral Q4H PRN McCarty, Artie, MD       cholecalciferol  (VITAMIN D3) 25 MCG (1000 UNIT) tablet 2,000 Units  2,000 Units Oral Daily Parker, Alvin S, MD   2,000 Units at 08/11/24 9260   haloperidol  (HALDOL ) tablet 5 mg  5 mg Oral TID PRN McCarty, Artie, MD   5 mg at 08/08/24 1201   And   diphenhydrAMINE  (BENADRYL ) capsule 50 mg  50 mg Oral TID PRN McCarty, Artie, MD   50 mg at 08/08/24 1201   haloperidol  lactate (HALDOL ) injection 5 mg  5 mg Intramuscular TID PRN McCarty, Artie, MD       And   diphenhydrAMINE  (BENADRYL ) injection 50 mg  50 mg Intramuscular TID PRN McCarty, Artie, MD       And   LORazepam  (ATIVAN ) injection 2 mg  2 mg Intramuscular TID PRN McCarty, Artie, MD       haloperidol  lactate (HALDOL ) injection 10 mg  10 mg Intramuscular TID PRN McCarty, Artie, MD       And   diphenhydrAMINE  (BENADRYL ) injection 50 mg  50 mg Intramuscular TID PRN McCarty, Artie, MD       And   LORazepam  (ATIVAN ) injection 2 mg  2 mg Intramuscular TID PRN McCarty, Artie, MD       hydrOXYzine  (ATARAX ) tablet 25 mg  25 mg Oral QHS McCarty, Artie, MD   25  mg at 08/10/24 2035   hydrOXYzine  (ATARAX ) tablet 25 mg  25 mg Oral TID PRN Prentis Kitchens A, DO   25 mg at 08/09/24 2336   magnesium  hydroxide (MILK OF MAGNESIA) suspension 30 mL  30 mL Oral Daily PRN McCarty, Artie, MD       risperiDONE  (RISPERDAL ) tablet 1 mg  1 mg Oral QHS Jakaila Norment I, NP   1 mg at 08/10/24 2034   Vitamin D  (Ergocalciferol ) (DRISDOL ) 1.25 MG (50000 UNIT) capsule 50,000 Units  50,000 Units Oral Q7 days Parker, Alvin S, MD   50,000 Units at 08/09/24 9144   Lab Results:  No results found for this or any  previous visit (from the past 48 hours).  Blood Alcohol level:  Lab Results  Component Value Date   [facility] <15 08/06/2024   ETH <15 07/20/2024    Metabolic Disorder Labs: Lab Results  Component Value Date   HGBA1C 4.6 (L) 07/20/2024   MPG 85 07/20/2024   No results found for: PROLACTIN Lab Results  Component Value Date   CHOL 133 07/22/2024   TRIG 36 07/22/2024   HDL 74 07/22/2024   CHOLHDL 1.8 07/22/2024   VLDL 7 07/22/2024   LDLCALC 52 07/22/2024   Physical Findings: AIMS:  ,  ,  ,  ,  ,  ,   CIWA:    COWS:     Musculoskeletal: Strength & Muscle Tone: within normal limits Gait & Station: normal Patient leans: N/A  Psychiatric Specialty Exam:  Presentation  General Appearance:  Appropriate for Environment; Casual; Fairly Groomed  Eye Contact: Good  Speech: Clear and Coherent; Normal Rate  Speech Volume: Normal  Handedness: Right   Mood and Affect  Mood: -- (Improving.)  Affect: Congruent; Appropriate  Thought Process  Thought Processes: Coherent; Goal Directed; Linear  Descriptions of Associations:Intact  Orientation:Full (Time, Place and Person)  Thought Content:Logical  History of Schizophrenia/Schizoaffective disorder:No  Duration of Psychotic Symptoms:N/A  Hallucinations:Hallucinations: None Description of Auditory Hallucinations: NA   Ideas of Reference:None  Suicidal Thoughts:Suicidal Thoughts:  No   Homicidal Thoughts:Homicidal Thoughts: No    Sensorium  Memory: Immediate Good; Recent Good; Remote Good  Judgment: Intact  Insight: -- (Improving.)  Executive Functions  Concentration: Good  Attention Span: Good  Recall: Good  Fund of Knowledge: Fair  Language: Good  Psychomotor Activity  Psychomotor Activity: Psychomotor Activity: Normal   Assets  Assets: Communication Skills; Desire for Improvement; Financial Resources/Insurance; Housing; Physical Health; Resilience; Social Support  Sleep  Sleep: Sleep: Good Number of Hours of Sleep: 6.5   Physical Exam: Physical Exam Vitals and nursing note reviewed.  HENT:     Head: Normocephalic.     Nose: Nose normal.     Mouth/Throat:     Pharynx: Oropharynx is clear.  Cardiovascular:     Rate and Rhythm: Normal rate.     Pulses: Normal pulses.  Pulmonary:     Effort: Pulmonary effort is normal.  Genitourinary:    Comments: Deferred Musculoskeletal:        General: Normal range of motion.     Cervical back: Normal range of motion.  Skin:    General: Skin is dry.  Neurological:     Mental Status: He is alert.    Review of Systems  Constitutional:  Negative for chills, diaphoresis and fever.  HENT:  Negative for congestion and sore throat.   Respiratory:  Negative for cough, shortness of breath and wheezing.   Cardiovascular:  Negative for chest pain and palpitations.  Gastrointestinal:  Negative for abdominal pain, constipation, diarrhea, heartburn, nausea and vomiting.  Genitourinary:  Negative for dysuria.  Musculoskeletal:  Negative for joint pain and myalgias.  Neurological:  Negative for dizziness, tingling, tremors, sensory change, speech change, focal weakness, seizures, loss of consciousness, weakness and headaches.  Endo/Heme/Allergies:        Allergy:  Other: Fish.  Psychiatric/Behavioral:  Positive for substance abuse. The patient is nervous/anxious.    Blood pressure 129/74,  pulse 64, temperature 97.8 F (36.6 C), temperature source Oral, resp. rate 16, height 5' 10 (1.778 m), weight 67.2 kg, SpO2 100%. Body mass index is 21.26 kg/m.  Treatment Plan Summary: Daily contact  with patient to assess and evaluate symptoms and progress in treatment and Medication management.   Principal/active diagnoses.  Psychoactive substance-induced psychosis (HCC) Hx. Cannabis use disorder.  R/o schizophrenia.  Plan: The risks/benefits/side-effects/alternatives to the medications in use were discussed in detail with the patient and time was given for patient's questions. The patient consents to medication trial.   -Continue Risperdal  1 mg po Q bedtime starting 08-10-24.  -Completed Risperdal  0.5 mg po tonight x 1 dose for mood control.  -Vitamin 50,000 units po Q 7 days for bone health.  -Continue Hydroxyzine  25 mg po tid prn for anxiety.   Agitation protocols.  -Continue as recommended (See MAR).  Other PRNS -Continue Tylenol  650 mg every 6 hours PRN for mild pain -Continue Maalox 30 ml Q 4 hrs PRN for indigestion -Continue MOM 30 ml po Q 6 hrs for constipation  Safety and Monitoring: Voluntary admission to inpatient psychiatric unit for safety, stabilization and treatment Daily contact with patient to assess and evaluate symptoms and progress in treatment Patient's case to be discussed in multi-disciplinary team meeting Observation Level : q15 minute checks Vital signs: q12 hours Precautions: Safety  Discharge Planning: Social work and case management to assist with discharge planning and identification of hospital follow-up needs prior to discharge Estimated LOS: 5-7 days Discharge Concerns: Need to establish a safety plan; Medication compliance and effectiveness Discharge Goals: Return home with outpatient referrals for mental health follow-up including medication management/psychotherapy  Mac Bolster, NP, pmhnp, fnp-bc. 08/11/2024, 3:13 PM Patient ID:  Joe Johnston, male   DOB: Jan 09, 2006, 18 y.o.   MRN: [id number] Patient ID: Joe Johnston, male   DOB: 01-19-2006, 18 y.o.   MRN: [id number]

## 2024-08-11 NOTE — Group Note (Signed)
 Date:  08/11/2024 Time:  5:50 PM  Group Topic/Focus:  Emotional Education:   The focus of this group is to discuss what feelings/emotions are, and how they are experienced.    Participation Level:  Active  Participation Quality:  Attentive  Affect:  Appropriate  Cognitive:  Appropriate  Insight: Appropriate  Engagement in Group:  Engaged  Modes of Intervention:  Discussion and Education    Huel Mall 08/11/2024, 5:50 PM

## 2024-08-11 NOTE — Group Note (Signed)
 Date:  08/11/2024 Time:  10:29 AM  Group Topic/Focus:  Developing a Wellness Toolbox:   The focus of this group is to help patients develop a wellness toolbox with skills and strategies to promote recovery upon discharge.    Participation Level:  Active  Participation Quality:  Appropriate  Affect:  Appropriate  Cognitive:  Appropriate  Insight: Appropriate  Engagement in Group:  Engaged  Modes of Intervention:  Discussion  Additional Comments:  discussion on feelings  Nat Rummer 08/11/2024, 10:29 AM

## 2024-08-11 NOTE — Progress Notes (Signed)
   08/11/24 0800  Psych Admission Type (Psych Patients Only)  Admission Status Voluntary  Psychosocial Assessment  Patient Complaints None  Eye Contact Fair  Facial Expression Animated  Affect Appropriate to circumstance  Speech Logical/coherent  Interaction Assertive  Motor Activity Other (Comment) (WDL)  Appearance/Hygiene Unremarkable  Behavior Characteristics Appropriate to situation  Mood Pleasant  Thought Process  Coherency WDL  Content WDL  Delusions None reported or observed  Perception WDL  Hallucination None reported or observed  Judgment Poor  Confusion None  Danger to Self  Current suicidal ideation? Denies  Agreement Not to Harm Self Yes  Description of Agreement verbal  Danger to Others  Danger to Others None reported or observed

## 2024-08-11 NOTE — Plan of Care (Addendum)
(  Sleep Hours) - 3.75 (Any PRNs that were needed, meds refused, or side effects to meds)- none (Any disturbances and when (visitation, over night)- none (Concerns raised by the patient)-  none (SI/HI/AVH)- Denies all

## 2024-08-11 NOTE — Group Note (Deleted)
 Date:  08/11/2024 Time:  5:30 PM  Group Topic/Focus:  Emotional Education:   The focus of this group is to discuss what feelings/emotions are, and how they are experienced.     Participation Level:  {BHH PARTICIPATION OZCZO:77735}  Participation Quality:  {BHH PARTICIPATION QUALITY:22265}  Affect:  {BHH AFFECT:22266}  Cognitive:  {BHH COGNITIVE:22267}  Insight: {BHH Insight2:20797}  Engagement in Group:  {BHH ENGAGEMENT IN HMNLE:77731}  Modes of Intervention:  {BHH MODES OF INTERVENTION:22269}  Additional Comments:  ***  Joe Johnston 08/11/2024, 5:30 PM

## 2024-08-11 NOTE — Group Note (Signed)
 Date:  08/11/2024 Time:  8:28 PM  Group Topic/Focus:  Goals Group:   The focus of this group is to help patients establish daily goals to achieve during treatment and discuss how the patient can incorporate goal setting into their daily lives to aide in recovery. Wrap-Up Group:   The focus of this group is to help patients review their daily goal of treatment and discuss progress on daily workbooks.    Participation Level:  Active  Participation Quality:  Appropriate  Affect:  Appropriate  Cognitive:  Appropriate  Insight: Appropriate  Engagement in Group:  Engaged  Modes of Intervention:  Discussion  Additional Comments:  Pt wants to be mindful of people he is around doing drugs  [name] 08/11/2024, 8:28 PM

## 2024-08-11 NOTE — Group Note (Signed)
 Date:  08/11/2024 Time:  10:09 AM  Group Topic/Focus:  Goals Group:   The focus of this group is to help patients establish daily goals to achieve during treatment and discuss how the patient can incorporate goal setting into their daily lives to aide in recovery.    Participation Level:  Active  Participation Quality:  Appropriate  Affect:  Appropriate  Cognitive:  Appropriate  Insight: Appropriate  Engagement in Group:  Engaged  Modes of Intervention:  Discussion  Additional Comments:  pt wants to talk more with his peers  Nat Rummer 08/11/2024, 10:09 AM

## 2024-08-11 NOTE — Progress Notes (Signed)
 Patient ID: Joe Johnston, male   DOB: 02-20-2006, 18 y.o.   MRN: [id number] CSW provided school note documentation as requested, placed on chart.

## 2024-08-11 NOTE — Progress Notes (Signed)
 Patient asked if he could be discharged today.  Feels like he has received help and has gone to normal now.  Scheduled to be discharged Monday.

## 2024-08-12 DIAGNOSIS — F1290 Cannabis use, unspecified, uncomplicated: Secondary | ICD-10-CM

## 2024-08-12 DIAGNOSIS — F19959 Other psychoactive substance use, unspecified with psychoactive substance-induced psychotic disorder, unspecified: Secondary | ICD-10-CM

## 2024-08-12 MED ORDER — HYDROXYZINE HCL 25 MG PO TABS
25.0000 mg | ORAL_TABLET | Freq: Two times a day (BID) | ORAL | Status: DC | PRN
  Filled 2024-08-12: qty 10

## 2024-08-12 MED ORDER — RISPERIDONE 1 MG PO TABS: 1.0000 mg | ORAL_TABLET | Freq: Every day | ORAL | 0 refills | Status: AC

## 2024-08-12 MED ORDER — VITAMIN D3 25 MCG PO TABS: 2000.0000 [IU] | ORAL_TABLET | Freq: Every day | ORAL | Status: AC

## 2024-08-12 NOTE — Progress Notes (Signed)
   08/11/24 2214  Psych Admission Type (Psych Patients Only)  Admission Status Voluntary  Psychosocial Assessment  Patient Complaints None  Eye Contact Fair  Facial Expression Animated  Affect Appropriate to circumstance  Speech Logical/coherent  Interaction Assertive  Motor Activity Other (Comment) (WDL)  Appearance/Hygiene Unremarkable  Behavior Characteristics Cooperative;Calm  Mood Pleasant  Thought Process  Coherency WDL  Content WDL  Delusions None reported or observed  Perception WDL  Hallucination None reported or observed  Judgment Poor  Confusion None  Danger to Self  Current suicidal ideation? Denies  Agreement Not to Harm Self Yes  Description of Agreement Verbal  Danger to Others  Danger to Others None reported or observed

## 2024-08-12 NOTE — BHH Suicide Risk Assessment (Signed)
 Suicide Risk Assessment  Discharge Assessment    [facility] Discharge Suicide Risk Assessment   Principal Problem: Psychoactive substance-induced psychosis (HCC) Discharge Diagnoses: Principal Problem:   Psychoactive substance-induced psychosis (HCC) Active Problems:   Cannabis use disorder   Total Time spent with patient: 30 minutes  Musculoskeletal: Strength & Muscle Tone: within normal limits Gait & Station: normal Patient leans: N/A  Psychiatric Specialty Exam  Presentation  General Appearance:  Casual; Neat  Eye Contact: Good  Speech: Clear and Coherent; Normal Rate  Speech Volume: Normal  Handedness: Right   Mood and Affect  Mood: Euthymic  Duration of Depression Symptoms: No data recorded Affect: Appropriate; Full Range   Thought Process  Thought Processes: Goal Directed; Linear  Descriptions of Associations:Intact  Orientation:Full (Time, Place and Person)  Thought Content:Logical  History of Schizophrenia/Schizoaffective disorder:No  Duration of Psychotic Symptoms:N/A  Hallucinations:Hallucinations: None  Ideas of Reference:None  Suicidal Thoughts:Suicidal Thoughts: No  Homicidal Thoughts:Homicidal Thoughts: No   Sensorium  Memory: Immediate Good; Recent Good; Remote Good  Judgment: Intact  Insight: Present   Executive Functions  Concentration: Good  Attention Span: Good  Recall: Good  Fund of Knowledge: Good  Language: Good   Psychomotor Activity  Psychomotor Activity:Psychomotor Activity: Normal   Assets  Assets: Communication Skills; Desire for Improvement; Housing; Resilience; Social Support; Vocational/Educational   Sleep  Sleep:Sleep: Good  Estimated Sleeping Duration (Last 24 Hours): 6.50-7.50 hours  Physical Exam: Physical Exam ROS Blood pressure 128/72, pulse 73, temperature 97.7 F (36.5 C), temperature source Oral, resp. rate 16, height 5' 10 (1.778 m), weight 67.2 kg, SpO2 100%. Body  mass index is 21.26 kg/m.  Mental Status Per Nursing Assessment::   On Admission:  Suicidal ideation indicated by patient, Self-harm thoughts  Demographic Factors:  Adolescent or young adult  Loss Factors: NA  Historical Factors: Impulsivity  Risk Reduction Factors:   Sense of responsibility to family, Religious beliefs about death, Living with another person, especially a relative, Positive social support, Positive therapeutic relationship, and Positive coping skills or problem solving skills  Continued Clinical Symptoms:  Alcohol/Substance Abuse/Dependencies Previous Psychiatric Diagnoses and Treatments  Cognitive Features That Contribute To Risk:  None    Suicide Risk:  Minimal: No identifiable suicidal ideation.  Patients presenting with no risk factors but with morbid ruminations; may be classified as minimal risk based on the severity of the depressive symptoms.  Patient is future oriented.  Discharged home in the care of his parents.   Follow-up Information     [facility]. Schedule an appointment as soon as possible for a visit on 08/12/2024.   Why: Please call your provider on 08/12/24 at 9:00 am to schedule an appointment for therapy services, as we were unable to contact prior to your discharge. Contact information: [address], [place], [place] [address] Phone: [phone]        [facility] Follow up on 08/14/2024.   Why: You have an appointment for medication management services on 08/14/24 at 3:00 pm, Virtual. Contact information: [address] [place] [address] [phone]                 Plan Of Care/Follow-up recommendations:  See discharge summary   Blair Chiquita Hint, NP 08/12/2024, 10:57 AM

## 2024-08-12 NOTE — Plan of Care (Signed)
   Problem: Education: Goal: Emotional status will improve Outcome: Progressing

## 2024-08-12 NOTE — Plan of Care (Signed)
   Problem: Education: Goal: Emotional status will improve Outcome: Progressing Goal: Mental status will improve Outcome: Progressing Goal: Verbalization of understanding the information provided will improve Outcome: Progressing   Problem: Activity: Goal: Interest or engagement in activities will improve Outcome: Progressing

## 2024-08-12 NOTE — Discharge Summary (Signed)
 Physician Discharge Summary Note  Patient:  Joe Johnston is an 18 y.o., male MRN:  [id number] DOB:  05-09-2006 Patient phone:  [phone] (home)  Patient address:   [address] [place] [address],  Total Time spent with patient: 30 minutes  Date of Admission:  08/07/2024 Date of Discharge: 08/12/24   Reason for Admission:  This is the second psychiatric admission/treatments in this [facility] for this 18 year old AA male. Joe Johnston was a patient in this [facility] from 07-20-24 to 07-23-24 for mood stabilization treatments. He was stabilized & discharged on medications with recommendations for a routine outpatient psychiatric follow-up care, medication management & counseling services. Joe Johnston is being re-admitted to the [facility] this time around from [facility] with complaint of delusional thoughts/thinking such as asking his mother strange questions about (what was going on in his class. He asked if he had hurt someone. He apparently asked his mother if he had killed his father).These strange questions/bizarre behaviors led to this patient being taken to an urgent care to be checked out. While at the urgent care, patient complained of weakness & was taken to the [facility] for medical evaluation. After medical evaluation/clearance, patient was recommended for inpatient psychiatric evaluation/treatments.    Joe Johnston is planned for discharge. He has been referred for outpatient counseling services. He is future oriented and motivated towards recovery.  He denies any current suicidal ideation, intent, or plan and homicidal ideation at the time of discharge.  He is aware of follow-up plans and demonstrates insight into the importance of ongoing treatment and abstaining from psychoactive substances. No current safety concerns or acute psychiatric symptoms observed.   Principal Problem: Psychoactive substance-induced psychosis (HCC) Discharge Diagnoses: Principal Problem:   Psychoactive  substance-induced psychosis (HCC) Active Problems:   Cannabis use disorder   Past Psychiatric History: See H&P  Past Medical History:  Past Medical History:  Diagnosis Date   Asthma    G6PD deficiency     Past Surgical History:  Procedure Laterality Date   MYRINGOTOMY WITH TUBE PLACEMENT     Family History: History reviewed. No pertinent family history. Family Psychiatric  History: See H&P Social History:  Social History   Substance and Sexual Activity  Alcohol Use Not Currently   Comment: patient uses alcohol occasionally     Social History   Substance and Sexual Activity  Drug Use Yes   Frequency: 4.0 times per week   Types: Marijuana   Comment: Uses 3/4 times per week    Social History   Socioeconomic History   Marital status: Single    Spouse name: Not on file   Number of children: Not on file   Years of education: Not on file   Highest education level: Not on file  Occupational History   Not on file  Tobacco Use   Smoking status: Some Days    Types: Cigarettes   Smokeless tobacco: Not on file  Substance and Sexual Activity   Alcohol use: Not Currently    Comment: patient uses alcohol occasionally   Drug use: Yes    Frequency: 4.0 times per week    Types: Marijuana    Comment: Uses 3/4 times per week   Sexual activity: Yes  Other Topics Concern   Not on file  Social History Narrative   Not on file   Social Drivers of Health   Financial Resource Strain: Not on file  Food Insecurity: No Food Insecurity (08/07/2024)   Hunger Vital Sign    Worried  About Running Out of Food in the Last Year: Never true    Ran Out of Food in the Last Year: Never true  Transportation Needs: No Transportation Needs (08/07/2024)   PRAPARE - Administrator, Civil Service (Medical): No    Lack of Transportation (Non-Medical): No  Physical Activity: Not on file  Stress: Not on file  Social Connections: Not on file    Hospital Course:  During the patient's  hospitalization, patient had extensive initial psychiatric evaluation, and follow-up psychiatric evaluations every day.  Psychiatric diagnoses provided upon initial assessment:  Principal Problem:   Psychoactive substance-induced psychosis (HCC) Active Problems:   Cannabis use disorder  Patient's psychiatric medications were adjusted on admission:  Resumed Risperdal  0.5 mg twice daily for mood control  Resumed hydroxyzine  25 mg nightly for anxiety and sleep enhancement  During the hospitalization, other adjustments were made to the patient's psychiatric medication regimen:  Titrated Risperdal  to 1 mg nightly  Patient's care was discussed during the interdisciplinary team meeting every day during the hospitalization.  The patient denies having side effects to prescribed psychiatric medication.  Gradually, patient started adjusting to milieu. The patient was evaluated each day by a clinical provider to ascertain response to treatment. Improvement was noted by the patient's report of decreasing symptoms, improved sleep and appetite, affect, medication tolerance, behavior, and participation in unit programming.  Patient was asked each day to complete a self inventory noting mood, mental status, pain, new symptoms, anxiety and concerns.    Symptoms were reported as significantly decreased or resolved completely by discharge.   On day of discharge, the patient reports that their mood is stable. The patient denied having suicidal thoughts for more than 48 hours prior to discharge.  Patient denies having homicidal thoughts.  Patient denies having auditory hallucinations.  Patient denies any visual hallucinations or other symptoms of psychosis. The patient was motivated to continue taking medication with a goal of continued improvement in mental health.   The patient reports their target psychiatric symptoms of anxiety, depression, and psychosis responded well to the psychiatric medications, and the  patient reports overall benefit other psychiatric hospitalization. Supportive psychotherapy was provided to the patient. The patient also participated in regular group therapy while hospitalized. Coping skills, problem solving as well as relaxation therapies were also part of the unit programming.  Labs were reviewed with the patient, and abnormal results were discussed with the patient.  The patient is able to verbalize their individual safety plan to this provider.  # It is recommended to the patient to continue psychiatric medications as prescribed, after discharge from the hospital.    # It is recommended to the patient to follow up with your outpatient psychiatric provider and PCP.  # It was discussed with the patient, the impact of alcohol, drugs, tobacco have been there overall psychiatric and medical wellbeing, and total abstinence from substance use was recommended the patient.ed.  # Prescriptions provided or sent directly to preferred pharmacy at discharge. Patient agreeable to plan. Given opportunity to ask questions. Appears to feel comfortable with discharge.    # In the event of worsening symptoms, the patient is instructed to call the crisis hotline, 911 and or go to the nearest ED for appropriate evaluation and treatment of symptoms. To follow-up with primary care provider for other medical issues, concerns and or health care needs  # Patient was discharged home in the care of his parents with a plan to follow up as noted below.  Physical Findings: AIMS:  , ,  , 0  ,  ,  ,   CIWA:   N/A COWS:   N/A  Musculoskeletal: Strength & Muscle Tone: within normal limits Gait & Station: normal Patient leans: N/A   Psychiatric Specialty Exam:  Presentation  General Appearance:  Casual; Neat  Eye Contact: Good  Speech: Clear and Coherent; Normal Rate  Speech Volume: Normal  Handedness: Right   Mood and Affect  Mood: Euthymic  Affect: Appropriate; Full  Range   Thought Process  Thought Processes: Goal Directed; Linear  Descriptions of Associations:Intact  Orientation:Full (Time, Place and Person)  Thought Content:Logical  History of Schizophrenia/Schizoaffective disorder:No  Duration of Psychotic Symptoms:N/A  Hallucinations:Hallucinations: None  Ideas of Reference:None  Suicidal Thoughts:Suicidal Thoughts: No  Homicidal Thoughts:Homicidal Thoughts: No   Sensorium  Memory: Immediate Good; Recent Good; Remote Good  Judgment: Intact  Insight: Present   Executive Functions  Concentration: Good  Attention Span: Good  Recall: Good  Fund of Knowledge: Good  Language: Good   Psychomotor Activity  Psychomotor Activity: Psychomotor Activity: Normal   Assets  Assets: Communication Skills; Desire for Improvement; Housing; Resilience; Social Support; Vocational/Educational   Sleep  Sleep: Sleep: Good  Estimated Sleeping Duration (Last 24 Hours): 6.50-7.50 hours   Physical Exam: Physical Exam Vitals and nursing note reviewed.  Constitutional:      General: He is not in acute distress.    Appearance: He is not ill-appearing.  HENT:     Mouth/Throat:     Pharynx: Oropharynx is clear.  Cardiovascular:     Rate and Rhythm: Normal rate.     Pulses: Normal pulses.  Pulmonary:     Effort: No respiratory distress.  Skin:    General: Skin is dry.  Neurological:     General: No focal deficit present.     Mental Status: He is alert and oriented to person, place, and time.  Psychiatric:        Mood and Affect: Mood normal.        Behavior: Behavior normal.        Thought Content: Thought content normal.        Judgment: Judgment normal.    Review of Systems  Constitutional: Negative.   HENT: Negative.    Respiratory: Negative.    Cardiovascular: Negative.   Gastrointestinal: Negative.   Genitourinary: Negative.   Skin: Negative.   Neurological: Negative.   Psychiatric/Behavioral:   Positive for depression (Stable for outpatient management) and substance abuse (Abstinence encouraged). Negative for hallucinations, memory loss and suicidal ideas. The patient is nervous/anxious (Stable for outpatient management) and has insomnia (Stable for outpatient management).    Blood pressure 128/72, pulse 73, temperature 97.7 F (36.5 C), temperature source Oral, resp. rate 16, height 5' 10 (1.778 m), weight 67.2 kg, SpO2 100%. Body mass index is 21.26 kg/m.   Social History   Tobacco Use  Smoking Status Some Days   Types: Cigarettes  Smokeless Tobacco Not on file   Tobacco Cessation:  N/A, patient does not currently use tobacco products   Blood Alcohol level:  Lab Results  Component Value Date   [facility] <15 08/06/2024   ETH <15 07/20/2024    Metabolic Disorder Labs:  Lab Results  Component Value Date   HGBA1C 4.6 (L) 07/20/2024   MPG 85 07/20/2024   No results found for: PROLACTIN Lab Results  Component Value Date   CHOL 133 07/22/2024   TRIG 36 07/22/2024   HDL 74 07/22/2024  CHOLHDL 1.8 07/22/2024   VLDL 7 07/22/2024   LDLCALC 52 07/22/2024    See Psychiatric Specialty Exam and Suicide Risk Assessment completed by Attending Physician prior to discharge.  Discharge destination:  Home  Is patient on multiple antipsychotic therapies at discharge:  No   Has Patient had three or more failed trials of antipsychotic monotherapy by history:  No  Recommended Plan for Multiple Antipsychotic Therapies: NA  Discharge Instructions     Diet - low sodium heart healthy   Complete by: As directed    Increase activity slowly   Complete by: As directed       Allergies as of 08/12/2024       Reactions   Fish Allergy         Medication List     STOP taking these medications    traZODone  50 MG tablet Commonly known as: DESYREL        TAKE these medications      Indication  hydrOXYzine  25 MG tablet Commonly known as: ATARAX  Take 1 tablet (25 mg  total) by mouth at bedtime.  Indication: Feeling Anxious   hydrOXYzine  25 MG tablet Commonly known as: ATARAX  Take 1 tablet (25 mg total) by mouth 2 (two) times daily as needed for anxiety.  Indication: Feeling Anxious   risperiDONE  1 MG tablet Commonly known as: RISPERDAL  Take 1 tablet (1 mg total) by mouth at bedtime. What changed:  medication strength how much to take  Indication: Mood control   Vitamin D  (Ergocalciferol ) 1.25 MG (50000 UNIT) Caps capsule Commonly known as: DRISDOL  Take 1 capsule (50,000 Units total) by mouth every 7 (seven) days.  Indication: Vitamin D  Deficiency   vitamin D3 25 MCG tablet Commonly known as: CHOLECALCIFEROL  Take 2 tablets (2,000 Units total) by mouth daily. Start taking on: August 13, 2024  Indication: Vitamin D  Deficiency        Follow-up Information     [facility]. Schedule an appointment as soon as possible for a visit on 08/12/2024.   Why: Please call your provider on 08/12/24 at 9:00 am to schedule an appointment for therapy services, as we were unable to contact prior to your discharge. Contact information: [address] [place], [place] [address] Phone: [phone]        [facility] Follow up on 08/14/2024.   Why: You have an appointment for medication management services on 08/14/24 at 3:00 pm, Virtual. Contact information: [address] [place] [address] [phone]                 Follow-up recommendations:   Activity: as tolerated  Diet: heart healthy  Other: -Follow-up with your outpatient psychiatric provider -instructions on appointment date, time, and address (location) are provided to you in discharge paperwork.  -Take your psychiatric medications as prescribed at discharge - instructions are provided to you in the discharge paperwork  -Follow-up with outpatient primary care doctor and other specialists -for management of preventative medicine and chronic medical  disease:  Vitamin D  deficiency  -Testing: Follow-up with outpatient provider for abnormal lab results:  Low vitamin D  level -If you are prescribed an atypical antipsychotic medication, we recommend that your outpatient psychiatrist follow routine screening for side effects within 3 months of discharge, including monitoring: AIMS scale, height, weight, blood pressure, fasting lipid panel, HbA1c, and fasting blood sugar.   -Recommend total abstinence from alcohol, tobacco, and other illicit drug use at discharge.   -If your psychiatric symptoms recur,  worsen, or if you have side effects to your psychiatric medications, call your outpatient psychiatric provider, 911, [phone] or go to the nearest emergency department.  -If suicidal thoughts occur, immediately call your outpatient psychiatric provider, 911, 988 or go to the nearest emergency department.    Signed: Blair Chiquita Hint, NP 08/12/2024, 11:03 AM

## 2024-08-12 NOTE — Group Note (Signed)
 Date:  08/12/2024 Time:  4:05 PM  Group Topic/Focus:  Goals Group:   The focus of this group is to help patients establish daily goals to achieve during treatment and discuss how the patient can incorporate goal setting into their daily lives to aide in recovery. Orientation:   The focus of this group is to educate the patient on the purpose and policies of crisis stabilization and provide a format to answer questions about their admission.  The group details unit policies and expectations of patients while admitted.    Participation Level:  Did Not Attend   Joe Johnston 08/12/2024, 4:05 PM

## 2024-08-12 NOTE — Plan of Care (Signed)
  Problem: Education: Goal: Knowledge of [facility] Education information/materials will improve Outcome: Adequate for Discharge Goal: Emotional status will improve 08/12/2024 1038 by Marty Eleanor NOVAK, RN Outcome: Adequate for Discharge 08/12/2024 1038 by Marty Eleanor NOVAK, RN Outcome: Progressing Goal: Mental status will improve Outcome: Adequate for Discharge Goal: Verbalization of understanding the information provided will improve Outcome: Adequate for Discharge

## 2024-08-12 NOTE — Progress Notes (Addendum)
 Tour of Duty:  Eleanor KATHEE Flemings, RN, 08/12/24, Tour of Duty: 0700-1900  SI/HI/AVH: Denies  Self-Reported   Mood: Positive  Anxiety: Denies Depression: Denies Irritability: Denies  Broset  Violence Prevention Guidelines *See Row Information*: Small Violence Risk interventions implemented   LBM  Last BM Date : 08/12/24   Pain: not present  Patient Refusals (including Rx): No  Shift Summary: Patient observed to be calm on unit. Patient able to make needs known. Patient observed to engage appropriately with staff and peers. Patient taking medications as prescribed. This shift, no PRN medication requested or required. No observed or reported side effects to medication. No observed or reported agitation, aggression, or other acute emotional distress. No observed or reported physical abnormalities or concerns.   Discharge Note:  Patient denies SI/HI/AVH at this time. Discharge instructions, AVS, prescriptions, and transition record gone over with patient. Patient agrees to comply with medication management, follow-up visit, and outpatient therapy. Patient belongings returned to patient. Patient questions and concerns addressed and answered. Patient ambulatory off unit. Patient discharged to home with mom. 7 days supply of Atarax  and Risperdal  provided to [place] with is AVS.    Last Vitals  Vitals Weight: 67.2 kg Temp: 97.7 F (36.5 C) Temp Source: Oral Pulse Rate: 73 Resp: 16 BP: 128/72 Patient Position: (not recorded)  Admission Type  Psych Admission Type (Psych Patients Only) Admission Status: Voluntary Date 72 hour document signed : (not recorded) Time 72 hour document signed : (not recorded) Provider Notified (First and Last Name) (see details for LINK to note): (not recorded)   Psychosocial Assessment  Psychosocial Assessment Patient Complaints: None (States I'm excited to leave) Eye Contact: Fair Facial Expression: Animated Affect: Appropriate to  circumstance Speech: Logical/coherent Interaction: Other (Comment) (WNL) Motor Activity: Other (Comment) (WNL) Appearance/Hygiene: Unremarkable Behavior Characteristics: Cooperative, Calm Mood: Pleasant   Aggressive Behavior  Targets: (not recorded)   Thought Process  Thought Process Coherency: Within Defined Limits Content: Within Defined Limits Delusions: None reported or observed Perception: Within Defined Limits Hallucination: None reported or observed Judgment: Poor Confusion: None  Danger to Self/Others  Danger to Self Current suicidal ideation?: Denies Description of Suicide Plan: (not recorded) Self-Injurious Behavior: (not recorded) Agreement Not to Harm Self: Yes Description of Agreement: Verbal Danger to Others: None reported or observed

## 2024-08-12 NOTE — Group Note (Signed)
 Date:  08/12/2024 Time:  4:46 PM  Group Topic/Focus:  Emotional and Physical Wellness Education:   The purpose of this group is to identify and challenge unhealthy thought patterns key to emotional wellness. The connection between emotional and physical wellness was also discussed.     Participation Level:  Active  Participation Quality:  Appropriate, Sharing, and Supportive  Affect:  Appropriate  Cognitive:  Appropriate  Insight: Appropriate  Engagement in Group:  Engaged  Modes of Intervention:  Discussion and Education  Additional Comments:    Cruz JONETTA Mars 08/12/2024, 4:46 PM

## 2024-08-12 NOTE — Progress Notes (Signed)
(  Sleep Hours) - 7.25 (Any PRNs that were needed, meds refused, or side effects to meds)- No PRN meds given, no meds refused.  (Any disturbances and when (visitation, over night)- None  (Concerns raised by the patient)- None  (SI/HI/AVH)- Denies SI/HI/AVH

## 2024-08-12 NOTE — Progress Notes (Signed)
  [facility] Adult Case Management Discharge Plan :  Will you be returning to the same living situation after discharge:  Yes,  pt returning home at discharge At discharge, do you have transportation home?: Yes,  pt will be picked up by mother around 1130AM Do you have the ability to pay for your medications: Yes,  pt has active health insurance coverage  Release of information consent forms completed and in the chart;  Patient's signature needed at discharge.  Patient to Follow up at:  Follow-up Information     [facility]. Schedule an appointment as soon as possible for a visit on 08/12/2024.   Why: Please call your provider on 08/12/24 at 9:00 am to schedule an appointment for therapy services, as we were unable to contact prior to your discharge. Contact information: [address], [place], [place] [address] Phone: [phone]        [facility] Follow up on 08/14/2024.   Why: You have an appointment for medication management services on 08/14/24 at 3:00 pm, Virtual. Contact information: [address] [place] [address] [phone]                 Next level of care provider has access to [facility] Link:no  Safety Planning and Suicide Prevention discussed: Yes,  Mother, Hardin Poche [phone]     Has patient been referred to the Quitline?: Patient refused referral for treatment  Patient has been referred for addiction treatment: Patient refused referral for treatment.  Jenkins LULLA Primer, LCSWA 08/12/2024, 9:43 AM
